# Patient Record
Sex: Male | Born: 1935 | Race: White | Hispanic: No | Marital: Married | Smoking: Never smoker
Health system: Southern US, Community
[De-identification: ages and names within clinical notes are randomized; demographics above are authoritative.]

## PROBLEM LIST (undated history)

## (undated) DIAGNOSIS — I2510 Atherosclerotic heart disease of native coronary artery without angina pectoris: Secondary | ICD-10-CM

## (undated) HISTORY — PX: CAROTID STENT: SHX1301

## (undated) HISTORY — PX: BACK SURGERY: SHX140

---

## 2000-02-03 ENCOUNTER — Encounter: Payer: Self-pay | Admitting: Emergency Medicine

## 2000-02-03 ENCOUNTER — Inpatient Hospital Stay (HOSPITAL_COMMUNITY): Admission: EM | Admit: 2000-02-03 | Discharge: 2000-02-06 | Payer: Self-pay | Admitting: Emergency Medicine

## 2000-02-04 HISTORY — PX: CARDIAC CATHETERIZATION: SHX172

## 2004-01-31 ENCOUNTER — Ambulatory Visit (HOSPITAL_COMMUNITY): Admission: RE | Admit: 2004-01-31 | Discharge: 2004-01-31 | Payer: Self-pay | Admitting: *Deleted

## 2004-03-24 HISTORY — PX: DOPPLER ECHOCARDIOGRAPHY: SHX263

## 2008-09-12 ENCOUNTER — Encounter: Admission: RE | Admit: 2008-09-12 | Discharge: 2008-09-12 | Payer: Self-pay | Admitting: Endocrinology

## 2009-06-17 ENCOUNTER — Emergency Department (HOSPITAL_COMMUNITY): Admission: EM | Admit: 2009-06-17 | Discharge: 2009-06-17 | Payer: Self-pay | Admitting: Emergency Medicine

## 2010-08-09 NOTE — Op Note (Signed)
NAME:  Austin Mays, Austin Mays           ACCOUNT NO.:  [id number]   MEDICAL RECORD NO.:  [id number]          PATIENT TYPE:  AMB   LOCATION:  ENDO                         FACILITY:  [facility]   PHYSICIAN:  Georgiana Spinner, M.D.    DATE OF BIRTH:  1923-11-20   DATE OF PROCEDURE:  01/31/2004  DATE OF DISCHARGE:                                 OPERATIVE REPORT   PROCEDURE:  Upper endoscopy.   INDICATIONS:  GERD and question of Hemoccult positivity.   ANESTHESIA:  Demerol 40, Versed 4 mg.   PROCEDURE:  With the patient mildly sedated in the left lateral decubitus  position, the Olympus videoscopic endoscope was inserted in the mouth and  passed under direct vision through the esophagus which appeared normal. We  entered into the stomach ____________ hiatal hernia. Fundus, body, antrum,  duodenal bulb, and second portion of the duodenum all appeared normal. From  this point, the endoscope was slowly withdrawn, taking circumferential views  of the duodenal mucosa until the endoscope had been pulled back into the  stomach, placed in retroflexion and viewed the stomach from below. The  endoscope was straightened and withdrawn, taking circumferential views of  the remaining gastric and esophageal mucosa. The patient's vital signs and  pulse oximetry remained stable. The patient tolerated the procedure well  without apparent complications.   FINDINGS:  Hiatal hernia, otherwise unremarkable examination.   PLAN:  Proceed to colonoscopy.      GMO/MEDQ  D:  01/31/2004  T:  01/31/2004  Job:  [id number]

## 2010-08-09 NOTE — Discharge Summary (Signed)
[place]  Patient:    Austin Mays, Austin Mays                  MRN: [id number] Adm. Date:  [id number] Disc. Date: [id number] Attending:  Clovis Cao CC:         Aram Candela. Aleen Campi, M.D.   Discharge Summary  CHIEF COMPLAINT:  Elbow and arm pain.  HISTORY OF PRESENT ILLNESS:  This 77 year old man, who has a history of hypothyroidism, but not known coronary artery disease, developed discomfort in the left elbow and arm, while splitting wood.  The pain persisted, and he eventually came to the emergency room.  PAST MEDICAL HISTORY/FAMILY HISTORY/SOCIAL HISTORY/REVIEW OF SYSTEMS/PHYSICAL EXAMINATION:  Adequate additional details are to be found in the history and physical examination.  LABORATORY DATA:  Electrocardiogram showed sinus rhythm with a slightly increased R-wave in lead V1, but no ST segment abnormality.  White cells 6400, hemoglobin 12.9, hematocrit 36.0, platelets 247,000. Unremarkable differential.  Sodium 137, potassium 4.0, creatinine 1.2, BUN 18, blood sugar 90.  CPK with MB 415 total initially, 16.5 MB.  Peak total 492, peak MB 35.3.  Lipid profile and cholesterol 127, triglycerides 139, HDL 30, LDL 69.  TSH 4.664.  HOSPITAL COURSE:  He became free of chest pain after admission, but was noted to have abnormal enzymes.  He was taken to the cardiac catheterization laboratory by Dr. Aram Candela. Tysinger on February 04, 2000, and the cardiac catheterization carried out.  The findings: 1. Critical stenosis of the obtuse marginal. 2. Severe stenosis of the right posterior descending. 3. Moderate stenosis of the mid-LAD. 4. Normal left ventricular function. 5. Normal abdominal aorta and renal arteries.  A successful percutaneous transluminal coronary angioplasty and stenting of the obtuse marginal-I lesion was carried out, and a successful Perclose of the right femoral artery.  The patient tolerated this procedure well, and was  returned to his room.  The next day he ambulated, and he did, however, have quite a bit of groin bleeding initially.  This was eventually stopped when he remained stable for 24 hours. He was ambulatory and considered stable and ready for discharge. There was still considerable bruising of the right groin, but there was no hematoma.  Discussions were held with the patient regarding abstinence from smoking, which he did not do beforehand, and the appropriate use of medications.  He was also encouraged to walk one-half an hour or more daily.  New prescriptions were written, and medications were discussed in complete detail.  DISCHARGE MEDICATIONS: 1. Altace 5 mg q.d. 2. Plavix 75 mg q.d. 3. Aspirin one q.d. 4. Metoprolol 50 mg 1/2 b.i.d. 5. Lopid 60 mg b.i.d. (because of the low HDL level).  He was encouraged to work to lose 20 pounds.  FOLLOWUP:  With Dr. Jaclyn Prime. [place] in two weeks.  DISCHARGE PROCESS:  Including a discussion of all of the above was extensive, requiring more than 40 minutes.  CONDITION ON DISCHARGE:  Stable and improved.  OPERATION:  Cardiac catheterization with a percutaneous transluminal coronary angioplasty and stent of the obtuse marginal-I coronary artery.  COMPLICATIONS:  Moderate groin hematoma.  RETURN TO WORK:  Inapplicable. DD:  03/11/00 TD:  03/11/00 Job: [id number]

## 2010-08-09 NOTE — Cardiovascular Report (Signed)
[facility]  Patient:    Austin Mays, Austin Mays                  MRN: [id number] Proc. Date: 02/04/00 Adm. Date:  [id number] Attending:  Clovis Cao CC:         Jaclyn Prime. Lucas Mallow, M.D.  [facility]   Cardiac Catheterization  REFERRING PHYSICIAN:  Dr. Jaclyn Prime. [place].  PROCEDURES 1. Left heart catheterization. 2. Coronary cineangiography. 3. Left ventricular cineangiography. 4. Angioplasty with percutaneous transluminal coronary angioplasty and stent    placement of the first obtuse marginal branch. 5. Perclose of the right femoral artery.  INDICATIONS FOR PROCEDURES:  This 77 year old male was admitted to the [facility] on February 03, 2000 with severe anterior chest pain.  This occurred after moderate exertion with splitting firewood and in the emergency room, his cardiac enzymes were positive for myocardial ischemia; he was then scheduled for cardiac catheterization and possible angioplasty.  DESCRIPTION OF PROCEDURE:  After signing an informed consent, the patient was premedicated with 50 mg of Benadryl intravenously and brought to the cardiac catheterization lab.  His right groin was prepped and draped in a sterile fashion and anesthetized locally with 1% lidocaine.  A 6-French introducer sheath was inserted percutaneously into the right femoral artery.  A 6-French #4 Judkins right coronary catheter was used to make injections in the right coronary artery.  There was a very elongated thoracic aorta which required a 6-French #5 Judkins coronary catheter to make injections into the left coronary artery.  A 6-French pigtail catheter was used to measure pressures in the left ventricle and aorta and to make midstream injections into the left ventricle and abdominal aorta.  After noting a critical 99-100% stenosis of the first obtuse marginal branch, along with several other stenotic lesions, we elected to proceed  with angioplasty of this critical stenosis with TIMI-1 flow.  The other stenotic lesions were within the right coronary artery, with a moderate-to-severe ostial lesion and a severe distal lesion in the mid-posterior descending.  The LAD had segmental narrowing of 50% in the distal segment.  The first diagonal branch had a 50% stenosis.  With only one critical stenosis with slow antegrade flow, we elected to proceed with the angioplasty of the first obtuse marginal branch.  A 6-French JL5 guide catheter was selected and after proper preparation, it was advanced to the root of the aorta.  We encountered much difficulty in obtaining access through the left coronary artery and several catheters were used which caused twisting of the catheter and we finally were able to cannulate the ostium of the left coronary artery with the 6-French JL5 guide catheter.  After obtaining access to the left coronary ______ , we then selected a Hi-Torque Floppy guidewire, which was advanced into the left coronary artery and into the mid-circumflex. After multiple attempts, we were unable to pass this guidewire out the first obtuse marginal branch because of marked tortuosity and we then selected a short luge guidewire, with which we were able to engage the proximal segment of the first obtuse marginal branch.  After multiple attempts, we were unable to advance this wire through the critically stenotic area.  We then elected to advance a traverse catheter over this wire and after multiple attempts, we were unable to advance this traverse catheter over the luge guidewire into the proximal segment of the first obtuse marginal.  With the traverse catheter inside the OM-1, we  were then able to advance the luge wire across the lesion. This was a short wire and therefore, we needed to exchange the short luge for a longer wire and we first attempted a long Cross-It guidewire and with this guidewire, we were unable to  advance it through the lesion.  We then selected a long luge guidewire and with the luge guidewire, we were able to again cross the lesion in the OM-1 and advance it to the distal segment.  We then selected a 2.5 x 15.0-mm CrossSail balloon catheter and with moderate-to-severe difficulty, we were able to advance this catheter over the guidewire, after removing the traverse catheter, and we were able to advance it into the OM-1 and across the lesion.  We then made two inflations with the CrossSail balloon, the first at 12 atmospheres for 60 seconds and the second at 14 atmospheres for 3 minutes.  After these inflations, the CrossSail balloon catheter was removed and further injections showed rebound stenosis which was progressive and rebounded to essentially total occlusion again.  We then selected a 3.0 x 8.0-mm Penta stent deployment system and after proper preparation, this Penta stent was advanced over the guidewire and again, with very much difficulty, it was advanced into the OM-1 and placed within the lesion.  The stent was then deployed with two inflations, the first at 8 atmospheres for 24 seconds and the second at 12 atmospheres for 30 seconds. After full deployment of the stent, the deployment balloon was removed and injections again into the left coronary artery showed an excellent angiographic result with 0% residual lesion and no evidence for dissection or clot.  There was reestablishment of normal TIMI-3 antegrade flow.  The patient tolerated the procedure well and no complications were noted.  At the end of the procedure, the catheter and sheath were removed from the right femoral artery and hemostasis was easily obtained with the Perclose closure system. Also note that for venous access, we inserted a 5-French sheath in the right femoral vein and used this during the procedure for an added IV line.  At the end of the procedure, this sheath was removed and venous stasis was  obtained with a Perclose closure system, because the ACT was still markedly elevated.   MEDICATIONS GIVEN DURING THE PROCEDURE:  Heparin 6000 units IV, Integrilin drip per pharmacy protocol.  HEMODYNAMIC DATA:  Left ventricular pressure 153/0-13.  Aortic pressure 153/78 with a mean of 107.  Left ventricular ejection fraction was estimated at 60%.  CINE FINDINGS Coronary cineangiography: 1. Left coronary artery:  The ostium and left main appear normal. 2. Left anterior descending:  The LAD has irregularities throughout its    proximal, mid and distal segment without significant stenosis until the    distal segment, where there is a segmental 50% stenosis.  There is good    antegrade flow.  The first diagonal branch has a 50% ostial lesion. 3. Circumflex coronary artery:  The proximal segment appears normal.  There is    then a trifurcation with OM-1 and OM-2 arising at this trifurcation, along    with a large continuation of the circumflex in the A-V groove, forming a    dominant circumflex supplying the posterolateral circulation to the left    ventricle.  The first obtuse marginal branch has a 30-40% ostial lesion and    the proximal segment had a 99% stenosis with very poor antegrade flow,    TIMI-1; this was a moderate-sized vessel.  The OM-2  had a 50% ostial    lesion, followed by a 30% lesion in the proximal segment.  There was normal    antegrade flow in the OM-2.  The continuation of the circumflex coronary    artery in the A-V groove appears normal, with good flow into the    posterolateral branches. 4. Right coronary artery:  The right coronary artery has an ostial lesion of    60-70%.  There is post-stenotic dilatation, making accurate estimation of    the ostial lesion difficult.  The right coronary artery from the conus to    the acute angle has diffuse atherosclerotic plaque with 30-40% segmental    narrowing throughout, extending through the acute angle.  The right     coronary artery terminates with a posterior descending branch and the    mid-posterior descending has a severe focal 90% stenosis.  There is good    antegrade flow through the stenotic area.  Left ventricular cineangiography:  The left ventricular chamber size and contractility appear normal.  The ejection fraction was estimated at approximately 60%.  The mitral and aortic valves appear normal.  Abdominal aortogram:  The abdominal aorta and renal arteries appear normal.  Angioplasty cines:  Cines taken during the angioplasty procedure show proper positioning of the guidewire and balloon catheter and finally, good positioning of the stent and stent deployment system.  Final injections into the left coronary artery showed an excellent angiographic result with 0% residual lesion, reestablishment of normal TIMI-3 antegrade flow and no evidence of dissection or clot.  FINAL DIAGNOSES 1. Critical stenosis, 99%, of the first obtuse marginal branch with TIMI-1    antegrade flow. 2. Severe stenosis, mid right posterior descending and ostial right coronary    artery, small vessel. 3. Moderate stenosis, distal left anterior descending. 4. Normal left ventricular function. 5. Normal abdominal aortogram and renal arteries. 6. Successful angioplasty with percutaneous transluminal coronary angioplasty    and stent placement in the first obtuse marginal branch. 7. Successful Perclose of the right femoral artery and right femoral vein. DD:  02/04/00 TD:  02/04/00 Job: [id number]

## 2010-08-09 NOTE — Op Note (Signed)
NAME:  Austin Mays, Applegate Rivan           ACCOUNT NO.:  [id number]   MEDICAL RECORD NO.:  [id number]          PATIENT TYPE:  AMB   LOCATION:  ENDO                         FACILITY:  [facility]   PHYSICIAN:  Georgiana Spinner, M.D.    DATE OF BIRTH:  24-Oct-1923   DATE OF PROCEDURE:  01/31/2004  DATE OF DISCHARGE:                                 OPERATIVE REPORT   PROCEDURE:  Colonoscopy.   INDICATIONS:  Questionable Hemoccult positivity.   ANESTHESIA:  Demerol 40 mg, Versed 5 mg additionally.   PROCEDURE:  With the patient mildly sedated in the left lateral decubitus  position, the Olympus videoscopic pediatric colonoscope was inserted in the  rectum and passed under direct vision to the right colon and despite turning  in multiple positions and pressure applied to the abdomen we could not  advance past this point, so therefore it was withdrawn.  Subsequently the  Olympus videoscopic variable-stiffness adult colonoscope was inserted in the  rectum, and again with position changes and pressure applied to the abdomen,  we subsequently were able to reach the cecum, identified by the ileocecal  valve and appendiceal orifice, both of which were photographed.  From this  point the colonoscope was slowly withdrawn taking circumferential views of  the colonic mucosa, stopping to photograph a diverticulum seen along the way  that was scattered actually throughout the colon but more so in the sigmoid  colon until we reached the rectum, which appeared normal on direct and  retroflexed view.  The endoscope was straightened and withdrawn.  The  patient's vital signs and pulse oximetry remained stable.  The patient  tolerated the procedure well without apparent complications.   FINDINGS:  Very difficult, tortuous colonic examination but no gross lesions  seen other than diverticula.   PLAN:  Have the patient follow up with me as needed.      GMO/MEDQ  D:  01/31/2004  T:  01/31/2004  Job:  [id number]

## 2012-04-14 ENCOUNTER — Emergency Department (HOSPITAL_COMMUNITY): Payer: Medicare Other

## 2012-04-14 ENCOUNTER — Emergency Department (HOSPITAL_COMMUNITY)
Admission: EM | Admit: 2012-04-14 | Discharge: 2012-04-14 | Disposition: A | Discharge status: Home / Self Care | Payer: Medicare Other | Attending: Emergency Medicine | Admitting: Emergency Medicine

## 2012-04-14 ENCOUNTER — Encounter (HOSPITAL_COMMUNITY): Payer: Self-pay | Admitting: *Deleted

## 2012-04-14 DIAGNOSIS — Z9861 Coronary angioplasty status: Secondary | ICD-10-CM | POA: Insufficient documentation

## 2012-04-14 DIAGNOSIS — S20229A Contusion of unspecified back wall of thorax, initial encounter: Secondary | ICD-10-CM | POA: Insufficient documentation

## 2012-04-14 DIAGNOSIS — Z8739 Personal history of other diseases of the musculoskeletal system and connective tissue: Secondary | ICD-10-CM | POA: Insufficient documentation

## 2012-04-14 DIAGNOSIS — I2510 Atherosclerotic heart disease of native coronary artery without angina pectoris: Secondary | ICD-10-CM | POA: Insufficient documentation

## 2012-04-14 DIAGNOSIS — Z79899 Other long term (current) drug therapy: Secondary | ICD-10-CM | POA: Insufficient documentation

## 2012-04-14 DIAGNOSIS — Y92009 Unspecified place in unspecified non-institutional (private) residence as the place of occurrence of the external cause: Secondary | ICD-10-CM | POA: Insufficient documentation

## 2012-04-14 DIAGNOSIS — Y9389 Activity, other specified: Secondary | ICD-10-CM | POA: Insufficient documentation

## 2012-04-14 DIAGNOSIS — W1809XA Striking against other object with subsequent fall, initial encounter: Secondary | ICD-10-CM | POA: Insufficient documentation

## 2012-04-14 HISTORY — DX: Atherosclerotic heart disease of native coronary artery without angina pectoris: I25.10

## 2012-04-14 NOTE — ED Notes (Signed)
Pt lost balance and fell backwards landing on butt; hitting back on corner of chimney; fell about 5pm; got up and went inside and called this evening due to continued c/o pain in back/buttocks and left scapular area; no shortness of breath; no head or neck injury

## 2012-04-14 NOTE — ED Provider Notes (Signed)
History     CSN: [id number]  Arrival date & time 04/14/12  2117   First MD Initiated Contact with Patient 04/14/12 2213      Chief Complaint  Patient presents with  . Fall   HPI  History provided by the patient. Patient is a 79 year old male with history of CAD and prior back surgery who presents with complaints of back pain after fall. Patient states that he was returning home and reaching for his keys to open his door while holding a sac of [employer] and his walking cane in his other hand. He states he lost his balance and reached for the handrail on his porch but this caused him to swing around and hit his left back and again is a core of the break home. Patient also fell onto his buttocks. Patient denied having any head injury or LOC. Initially he denied having significant pain or soreness the later had more pain to his mid back near his scapula. Patient did take one of his prescribed Vicodin for pain is relief. He presents concerned of possible broken rib. Denies any shortness of breath or lightheadedness. Denies any low back or pain to the sacrum. Denies any weakness or numbness in extremities.    Past Medical History  Diagnosis Date  . Coronary artery disease     Past Surgical History  Procedure Date  . Back surgery   . Carotid stent     No family history on file.  History  Substance Use Topics  . Smoking status: Never Smoker   . Smokeless tobacco: Not on file  . Alcohol Use: No      Review of Systems  Respiratory: Negative for shortness of breath.   Cardiovascular: Negative for chest pain.  Musculoskeletal: Positive for back pain.  Neurological: Negative for dizziness, light-headedness and headaches.  All other systems reviewed and are negative.    Allergies  Review of patient's allergies indicates no known allergies.  Home Medications   Current Outpatient Rx  Name  Route  Sig  Dispense  Refill  . ATORVASTATIN CALCIUM 20 MG PO TABS   Oral   Take 10  mg by mouth daily.         Marland Kitchen CLOPIDOGREL BISULFATE 75 MG PO TABS   Oral   Take 75 mg by mouth daily.         Marland Kitchen HYDROCODONE-ACETAMINOPHEN 5-500 MG PO TABS   Oral   Take 1 tablet by mouth every 6 (six) hours as needed. Pain         . LEVOTHYROXINE SODIUM 88 MCG PO TABS   Oral   Take 88 mcg by mouth daily.         Marland Kitchen METOPROLOL TARTRATE 25 MG PO TABS   Oral   Take 12.5 mg by mouth 2 (two) times daily.           BP 114/62  Pulse 67  Temp 97.7 F (36.5 C) (Oral)  Resp 18  SpO2 99%  Physical Exam  Nursing note and vitals reviewed. Constitutional: He appears well-developed and well-nourished. No distress.  HENT:  Head: Normocephalic and atraumatic.  Neck: Normal range of motion. Neck supple.       No cervical midline tenderness  Cardiovascular: Normal rate and regular rhythm.   Pulmonary/Chest: Effort normal and breath sounds normal. No respiratory distress. He has no wheezes. He has no rales.  Abdominal: Soft. There is no tenderness. There is no rebound and no guarding.  Musculoskeletal:  Thoracic back: He exhibits tenderness and swelling. He exhibits no deformity.       Back:       No spinous tenderness. Normal extremities at baseline.  Neurological: He is alert.  Skin: Skin is warm.  Psychiatric: He has a normal mood and affect. His behavior is normal.    ED Course  Procedures    Dg Ribs Unilateral W/chest Left  04/14/2012  *RADIOLOGY REPORT*  Clinical Data: Status post fall; left upper back and rib pain.  LEFT RIBS AND CHEST - 3+ VIEW  Comparison: None.  Findings: No displaced rib fractures are seen.  The lungs are well-aerated.  Minimal left basilar atelectasis is noted.  There is no evidence of focal opacification, pleural effusion or pneumothorax.  The cardiomediastinal silhouette is borderline normal in size.  No acute osseous abnormalities are seen.  IMPRESSION:  1.  No displaced rib fractures seen. 2.  Minimal left basilar atelectasis noted; lungs  otherwise clear.   Original Report Authenticated By: Tonia Ghent, M.D.      1. Contusion of back       MDM  10:35 PM patient seen and evaluated. Patient resting on his right side does not appear in any acute distress.        Angus Seller, [place] 04/14/12 2333

## 2012-04-15 NOTE — ED Provider Notes (Signed)
Medical screening examination/treatment/procedure(s) were conducted as a shared visit with non-physician practitioner(s) and myself.  I personally evaluated the patient during the encounter  Pt had a mechanical fall.  No signs of serious injury on exam..  Dc home with supportive care.  Celene Kras, MD 04/15/12 [phone]

## 2013-12-19 ENCOUNTER — Ambulatory Visit (INDEPENDENT_AMBULATORY_CARE_PROVIDER_SITE_OTHER): Payer: 59 | Admitting: Internal Medicine

## 2013-12-19 ENCOUNTER — Encounter: Payer: Self-pay | Admitting: Internal Medicine

## 2013-12-19 VITALS — BP 126/76 | HR 50 | Ht 76.0 in | Wt 216.1 lb

## 2013-12-19 DIAGNOSIS — E038 Other specified hypothyroidism: Secondary | ICD-10-CM

## 2013-12-19 DIAGNOSIS — N400 Enlarged prostate without lower urinary tract symptoms: Secondary | ICD-10-CM

## 2013-12-19 DIAGNOSIS — I447 Left bundle-branch block, unspecified: Secondary | ICD-10-CM | POA: Insufficient documentation

## 2013-12-19 DIAGNOSIS — E039 Hypothyroidism, unspecified: Secondary | ICD-10-CM | POA: Insufficient documentation

## 2013-12-19 DIAGNOSIS — I10 Essential (primary) hypertension: Secondary | ICD-10-CM

## 2013-12-19 DIAGNOSIS — I2510 Atherosclerotic heart disease of native coronary artery without angina pectoris: Secondary | ICD-10-CM

## 2013-12-19 NOTE — Progress Notes (Signed)
OFFICE NOTE  Chief Complaint:  No complaints  Primary Care Physician: Michiel Sites, MD  HPI:  Austin Mays is an 80 year old male who I previously saw 2011, more than 3 years ago. He has been treated by Dr. Aleen Campi in the past. In 2001 he had a non-STEMI and will received a bare-metal stent to the obtuse marginal branch. He also has hypertension and dyslipidemia. He did get some care through the [place] in [place]. He's been on Plavix monotherapy for PAD and coronary disease. He also has hypothyroidism and BPH. He denies any chest pain or worsening shortness of breath with exertion however is not very active, but does still manage to drive.  PMHx:  Past Medical History  Diagnosis Date  . Coronary artery disease     Past Surgical History  Procedure Laterality Date  . Back surgery    . Carotid stent    . Cardiac catheterization  02/04/2000    mild MI - stent by Dr. Aleen Campi    FAMHx:  Family History  Problem Relation Age of Onset  . Cancer Son 22  . Leukemia Daughter 60    SOCHx:   reports that he has never smoked. He does not have any smokeless tobacco history on file. He reports that he does not drink alcohol. His drug history is not on file.  ALLERGIES:  No Known Allergies  ROS: A comprehensive review of systems was negative.  HOME MEDS: Current Outpatient Prescriptions  Medication Sig Dispense Refill  . atorvastatin (LIPITOR) 20 MG tablet Take 10 mg by mouth daily.      . clopidogrel (PLAVIX) 75 MG tablet Take 75 mg by mouth daily.      Marland Kitchen docusate sodium (COLACE) 100 MG capsule Take 100 mg by mouth 2 (two) times daily.      Marland Kitchen HYDROcodone-acetaminophen (NORCO/VICODIN) 5-325 MG per tablet Take 1 tablet by mouth every 6 (six) hours as needed for moderate pain.      Marland Kitchen latanoprost (XALATAN) 0.005 % ophthalmic solution Place 1 drop into both eyes at bedtime.      Marland Kitchen levothyroxine (SYNTHROID, LEVOTHROID) 88 MCG tablet Take 88 mcg by mouth daily.        . metoprolol tartrate (LOPRESSOR) 25 MG tablet Take 12.5 mg by mouth 2 (two) times daily.      . tamsulosin (FLOMAX) 0.4 MG CAPS capsule Take 0.4 mg by mouth.       No current facility-administered medications for this visit.    LABS/IMAGING: No results found for this or any previous visit (from the past 48 hour(s)). No results found.  VITALS: BP 126/76  Pulse 50  Ht  (1.93 m)  Wt 216 lb 1.6 oz (98.022 kg)  BMI 26.32 kg/m2  EXAM: General appearance: alert and no distress Neck: no carotid bruit and no JVD Lungs: clear to auscultation bilaterally Heart: regular rate and rhythm, S1, S2 normal, no murmur, click, rub or gallop Abdomen: soft, non-tender; bowel sounds normal; no masses,  no organomegaly Extremities: extremities normal, atraumatic, no cyanosis or edema Pulses: 2+ and symmetric Skin: Skin color, texture, turgor normal. No rashes or lesions Neurologic: Grossly normal Psych: Normal  EKG: Sinus bradycardia with first degree AV block, PVC, left bundle branch block at 50  ASSESSMENT: 1. Coronary artery disease status post PCI to the OM1 in 2001 2. Hypertension-controlled 3. Left bundle branch block 4. Dyslipidemia  PLAN: 1.   Austin Mays is doing fairly well given his age. He is not  describing any anginal symptoms. He is not exerting himself enough I believe to bring out the symptoms. His blood pressure is well controlled and has a stable left bundle with a low heart rate but he is not symptomatic with that. His cholesterol is followed by his primary care provider. Overall I think he is doing well for his age and would not change any medications.  Chrystie Nose, MD, [facility] Attending Cardiologist [facility]  HILTY,Kenneth C 12/19/2013, 4:40 PM

## 2013-12-19 NOTE — Patient Instructions (Signed)
Your physician wants you to follow-up in: 1 year with Dr. Hilty. You will receive a reminder letter in the mail two months in advance. If you don't receive a letter, please call our office to schedule the follow-up appointment.  

## 2014-07-24 ENCOUNTER — Encounter: Payer: Self-pay | Admitting: *Deleted

## 2014-10-13 ENCOUNTER — Other Ambulatory Visit: Payer: Self-pay | Admitting: Endocrinology

## 2014-10-13 DIAGNOSIS — R630 Anorexia: Secondary | ICD-10-CM

## 2014-10-13 DIAGNOSIS — R634 Abnormal weight loss: Secondary | ICD-10-CM

## 2014-10-18 ENCOUNTER — Inpatient Hospital Stay: Admission: RE | Admit: 2014-10-18 | Payer: Self-pay | Source: Ambulatory Visit

## 2014-11-08 ENCOUNTER — Ambulatory Visit
Admission: RE | Admit: 2014-11-08 | Discharge: 2014-11-08 | Disposition: A | Discharge status: Home / Self Care | Payer: Self-pay | Source: Ambulatory Visit | Attending: Endocrinology | Admitting: Endocrinology

## 2014-11-08 DIAGNOSIS — R630 Anorexia: Secondary | ICD-10-CM

## 2014-11-08 DIAGNOSIS — R634 Abnormal weight loss: Secondary | ICD-10-CM

## 2014-12-08 ENCOUNTER — Other Ambulatory Visit: Payer: Self-pay | Admitting: Physician Assistant

## 2014-12-08 DIAGNOSIS — M545 Low back pain: Secondary | ICD-10-CM

## 2014-12-12 ENCOUNTER — Inpatient Hospital Stay: Admission: RE | Admit: 2014-12-12 | Payer: Self-pay | Source: Ambulatory Visit

## 2014-12-27 ENCOUNTER — Other Ambulatory Visit: Payer: Self-pay

## 2016-02-07 ENCOUNTER — Ambulatory Visit (INDEPENDENT_AMBULATORY_CARE_PROVIDER_SITE_OTHER): Payer: 59 | Admitting: Orthopaedic Surgery

## 2016-02-07 ENCOUNTER — Ambulatory Visit (INDEPENDENT_AMBULATORY_CARE_PROVIDER_SITE_OTHER): Payer: 59

## 2016-02-07 DIAGNOSIS — M25552 Pain in left hip: Secondary | ICD-10-CM

## 2016-02-07 NOTE — Progress Notes (Signed)
Office Visit Note   Patient: Austin Mays           Date of Birth: January 01, 1924           MRN: [id number] Visit Date: 02/07/2016              Requested by: Darci NeedleWalter Kohut, MD [address] [place], [place] [id number] PCP: Michiel SitesKOHUT,WALTER DENNIS, MD   Assessment & Plan: Visit Diagnoses:  1. Pain in left hip     Plan: Right now he does not really hurt in his left hip at all and his exam is normal. I think this may be posture related and he may have pulled a muscle from where he slouches on the couch and then slumps over 2 head his dog. He does have Vicodin that he takes at home occasionally. He can also try anti-inflammatories and intermittent ice and heat. He'll follow up as needed.  Follow-Up Instructions: Return if symptoms worsen or fail to improve.   Orders:  Orders Placed This Encounter  Procedures  . XR HIP UNILAT W OR W/O PELVIS 1V LEFT   No orders of the defined types were placed in this encounter.     Procedures: No procedures performed   Clinical Data: No additional findings.   Subjective: Chief Complaint  Patient presents with  . Left Hip - Pain    Patient states he has on and off pain. On the "on days" it is a 10/10 pain. He wants this checked out     HPI His pain occurred acutely after leaning over from the couch to pad his dog. It was 10 out of 10 in terms of pain severity when it happened but now it's felt better as the week is progressed Review of Systems Negative for chest pain shortness breath fever or chills  Objective: Vital Signs: There were no vitals taken for this visit.  Physical Exam He is alert and oriented in no acute distress Ortho Exam Emanation of his left hip is basically normal today. He has fluid active and passive range of motion of the hip and is no pain to palpation around his proximal femur or pelvis Specialty Comments:  No specialty comments available.  Imaging: Xr Hip Unilat W Or W/o Pelvis 1v  Left  Result Date: 02/07/2016 An AP pelvis and a lateral his left hip show no acute bony injury. His hips are well located. There is no significant arthritic findings or cortical irregularities around either hip or pelvis. There is no evidence of any lytic lesion.    PMFS History: Patient Active Problem List   Diagnosis Date Noted  . CAD (coronary artery disease) 12/19/2013  . LBBB (left bundle branch block) 12/19/2013  . Hypothyroidism 12/19/2013  . BPH (benign prostatic hyperplasia) 12/19/2013  . HTN (hypertension) 12/19/2013   Past Medical History:  Diagnosis Date  . Coronary artery disease     Family History  Problem Relation Age of Onset  . Cancer Son [id number]  . Leukemia Daughter 10    Past Surgical History:  Procedure Laterality Date  . BACK SURGERY    . CARDIAC CATHETERIZATION  02/04/2000   mild MI - stent by Dr. Aleen Campiysinger  . CAROTID STENT    . DOPPLER ECHOCARDIOGRAPHY  2006   Social History   Occupational History  . Not on file.   Social History Main Topics  . Smoking status: Never Smoker  . Smokeless tobacco: Not on file  . Alcohol use No  . Drug  use: Unknown  . Sexual activity: Not on file

## 2016-07-03 ENCOUNTER — Emergency Department (HOSPITAL_COMMUNITY): Payer: Medicare Other

## 2016-07-03 ENCOUNTER — Emergency Department (HOSPITAL_COMMUNITY)
Admission: EM | Admit: 2016-07-03 | Discharge: 2016-07-03 | Disposition: A | Discharge status: Home / Self Care | Payer: Medicare Other | Attending: Emergency Medicine | Admitting: Emergency Medicine

## 2016-07-03 ENCOUNTER — Encounter (HOSPITAL_COMMUNITY): Payer: Self-pay | Admitting: Emergency Medicine

## 2016-07-03 DIAGNOSIS — I2510 Atherosclerotic heart disease of native coronary artery without angina pectoris: Secondary | ICD-10-CM | POA: Diagnosis not present

## 2016-07-03 DIAGNOSIS — I10 Essential (primary) hypertension: Secondary | ICD-10-CM | POA: Diagnosis not present

## 2016-07-03 DIAGNOSIS — R600 Localized edema: Secondary | ICD-10-CM | POA: Diagnosis not present

## 2016-07-03 DIAGNOSIS — E039 Hypothyroidism, unspecified: Secondary | ICD-10-CM | POA: Diagnosis not present

## 2016-07-03 DIAGNOSIS — M7989 Other specified soft tissue disorders: Secondary | ICD-10-CM | POA: Diagnosis present

## 2016-07-03 LAB — CBC WITH DIFFERENTIAL/PLATELET
BASOS PCT: 1 %
Basophils Absolute: 0 10*3/uL (ref 0.0–0.1)
Eosinophils Absolute: 0.1 10*3/uL (ref 0.0–0.7)
Eosinophils Relative: 3 %
HEMATOCRIT: 35 % — AB (ref 39.0–52.0)
HEMOGLOBIN: 11.8 g/dL — AB (ref 13.0–17.0)
LYMPHS ABS: 1.4 10*3/uL (ref 0.7–4.0)
Lymphocytes Relative: 36 %
MCH: 29 pg (ref 26.0–34.0)
MCHC: 33.7 g/dL (ref 30.0–36.0)
MCV: 86 fL (ref 78.0–100.0)
MONOS PCT: 12 %
Monocytes Absolute: 0.5 10*3/uL (ref 0.1–1.0)
NEUTROS ABS: 1.9 10*3/uL (ref 1.7–7.7)
NEUTROS PCT: 48 %
Platelets: 178 10*3/uL (ref 150–400)
RBC: 4.07 MIL/uL — ABNORMAL LOW (ref 4.22–5.81)
RDW: 14.5 % (ref 11.5–15.5)
WBC: 4 10*3/uL (ref 4.0–10.5)

## 2016-07-03 LAB — COMPREHENSIVE METABOLIC PANEL
ALT: 12 U/L — ABNORMAL LOW (ref 17–63)
ANION GAP: 7 (ref 5–15)
AST: 21 U/L (ref 15–41)
Albumin: 3.6 g/dL (ref 3.5–5.0)
Alkaline Phosphatase: 75 U/L (ref 38–126)
BUN: 26 mg/dL — ABNORMAL HIGH (ref 6–20)
CO2: 23 mmol/L (ref 22–32)
Calcium: 8.4 mg/dL — ABNORMAL LOW (ref 8.9–10.3)
Chloride: 109 mmol/L (ref 101–111)
Creatinine, Ser: 1.48 mg/dL — ABNORMAL HIGH (ref 0.61–1.24)
GFR calc non Af Amer: 40 mL/min — ABNORMAL LOW (ref >60)
GFR, EST AFRICAN AMERICAN: 46 mL/min — AB (ref >60)
Glucose, Bld: 99 mg/dL (ref 65–99)
Potassium: 3.9 mmol/L (ref 3.5–5.1)
SODIUM: 139 mmol/L (ref 135–145)
Total Bilirubin: 0.6 mg/dL (ref 0.3–1.2)
Total Protein: 6.6 g/dL (ref 6.5–8.1)

## 2016-07-03 LAB — I-STAT TROPONIN, ED: Troponin i, poc: 0 ng/mL (ref 0.00–0.08)

## 2016-07-03 LAB — BRAIN NATRIURETIC PEPTIDE: B NATRIURETIC PEPTIDE 5: 57.5 pg/mL (ref 0.0–100.0)

## 2016-07-03 NOTE — Discharge Instructions (Signed)
IMPORTANT PATIENT INSTRUCTIONS:  You have been scheduled for an Outpatient Vascular Study at [facility].   ° °If tomorrow is a Saturday or Sunday, please go to the [facility] Registration Desk at 8 am tomorrow morning and tell them you are there for a vascular study.  If tomorrow is a weekday (Monday-Friday), please go to [facility] at 8 am and tell them you are there for a vascular study. °

## 2016-07-03 NOTE — ED Provider Notes (Signed)
Emergency Department Provider Note   I have reviewed the triage vital signs and the nursing notes.   HISTORY  Chief Complaint Foot Swelling   HPI Austin Mays is a 83 y.o. male with PMH of CAD presents to the emergency department for evaluation of bilateral lower extremity edema over the past several days. Patient with no history of congestive heart failure or kidney disease. He injured the third toe on his right foot and both legs seem to be swelling since. No drainage. No fever, chills, or weakness. Patient is ambulatory with mild pain. No history or DVT/PE. No CP or SOB. No radion of symptoms.   Past Medical History:  Diagnosis Date  . Coronary artery disease     Patient Active Problem List   Diagnosis Date Noted  . CAD (coronary artery disease) 12/19/2013  . LBBB (left bundle branch block) 12/19/2013  . Hypothyroidism 12/19/2013  . BPH (benign prostatic hyperplasia) 12/19/2013  . HTN (hypertension) 12/19/2013    Past Surgical History:  Procedure Laterality Date  . BACK SURGERY    . CARDIAC CATHETERIZATION  02/04/2000   mild MI - stent by Dr. Aleen Campi  . CAROTID STENT    . DOPPLER ECHOCARDIOGRAPHY  2006    Current Outpatient Rx  . Order #: [id number] Class: Historical Med  . Order #: [id number] Class: Historical Med  . Order #: [id number] Class: Historical Med  . Order #: [id number] Class: Historical Med  . Order #: [id number] Class: Historical Med  . Order #: [id number] Class: Historical Med    Allergies Patient has no known allergies.  Family History  Problem Relation Age of Onset  . Cancer Son 45  . Leukemia Daughter 82    Social History Social History  Substance Use Topics  . Smoking status: Never Smoker  . Smokeless tobacco: Never Used  . Alcohol use No    Review of Systems  Constitutional: No fever/chills Eyes: No visual changes. ENT: No sore throat. Cardiovascular: Denies chest pain. Respiratory: Denies shortness of breath. Gastrointestinal: No  abdominal pain.  No nausea, no vomiting.  No diarrhea.  No constipation. Genitourinary: Negative for dysuria. Musculoskeletal: Negative for back pain. Positive B/L LE edema (R>L).  Skin: Negative for rash.  Neurological: Negative for headaches, focal weakness or numbness.  10-point ROS otherwise negative.  ____________________________________________   PHYSICAL EXAM:  VITAL SIGNS: ED Triage Vitals  Enc Vitals Group     BP 07/03/16 1924 (!) 149/78     Pulse Rate 07/03/16 1924 66     Resp 07/03/16 1924 (!) 64     Temp 07/03/16 1924 97.8 F (36.6 C)     Temp Source 07/03/16 1924 Oral     SpO2 07/03/16 1924 93 %     Weight 07/03/16 1935 220 lb (99.8 kg)     Height 07/03/16 1935  (1.93 m)   Constitutional: Alert and oriented. Well appearing and in no acute distress. Eyes: Conjunctivae are normal. Head: Atraumatic. Nose: No congestion/rhinnorhea. Mouth/Throat: Mucous membranes are moist.  Oropharynx non-erythematous. Neck: No stridor.  Cardiovascular: Normal rate, regular rhythm. Good peripheral circulation. Grossly normal heart sounds.   Respiratory: Normal respiratory effort.  No retractions. Lungs CTAB. Gastrointestinal: Soft and nontender. No distention.  Musculoskeletal: No lower extremity tenderness. Bilateral LE edema. No redness or warmth. No drainage. No gross deformities of extremities. Neurologic:  Normal speech and language. No gross focal neurologic deficits are appreciated.  Skin:  Skin is warm, dry and intact.  ____________________________________________   LABS (all labs ordered are listed,  but only abnormal results are displayed)  Labs Reviewed  COMPREHENSIVE METABOLIC PANEL - Abnormal; Notable for the following:       Result Value   BUN 26 (*)    Creatinine, Ser 1.48 (*)    Calcium 8.4 (*)    ALT 12 (*)    GFR calc non Af Amer 40 (*)    GFR calc Af Amer 46 (*)    All other components within normal limits  CBC WITH DIFFERENTIAL/PLATELET -  Abnormal; Notable for the following:    RBC 4.07 (*)    Hemoglobin 11.8 (*)    HCT 35.0 (*)    All other components within normal limits  BRAIN NATRIURETIC PEPTIDE  I-STAT TROPOININ, ED   ____________________________________________  EKG   EKG Interpretation  Date/Time:  Thursday July 03 2016 19:55:13 EDT Ventricular Rate:  60 PR Interval:    QRS Duration: 169 QT Interval:  495 QTC Calculation: 495 R Axis:   -18 Text Interpretation:  Sinus rhythm Prolonged PR interval Left bundle branch block New LBBB but last tracing 2001. No STEMI.  Confirmed by Marylon Verno MD, Merion Grimaldo [id number]) on 07/03/2016 11:15:20 PM Also confirmed by Ruthann Angulo MD, Alok Minshall [phone]), editor [place] CT, Jola Babinski [id number])  on 07/04/2016 7:43:47 AM       ____________________________________________  RADIOLOGY  Dg Chest 2 View  Result Date: 07/03/2016 CLINICAL DATA:  Swelling of the lower extremities and weakness. EXAM: CHEST  2 VIEW COMPARISON:  04/14/2012 CXR FINDINGS: Air-fluid level within a moderate to large hiatal hernia is again visualized. The heart is top-normal in size. There is aortic atherosclerosis without aneurysmal dilatation. No acute pneumonic consolidation, effusion or pneumothorax. Degenerative changes are seen along the dorsal spine. IMPRESSION: No active cardiopulmonary disease.  Moderate to large hiatal hernia. Electronically Signed   By: Tollie Eth M.D.   On: 07/03/2016 20:45    ____________________________________________   PROCEDURES  Procedure(s) performed:   Procedures  None ____________________________________________   INITIAL IMPRESSION / ASSESSMENT AND PLAN / ED COURSE  Pertinent labs & imaging results that were available during my care of the patient were reviewed by me and considered in my medical decision making (see chart for details).  Patient resents to the emergency room in for evaluation of bilateral lower extremity edema in the setting of trauma several days ago. Child feel  that the foot traumas related to the patient's swelling. He seems to have mild swelling of his bilateral lower extremities. He insisted the right lower shin he may be larger than the left which may be true but only slightly. No overlying cellulitis. Normal pulses and sensation. No evidence of cellulitis, septic joint, or injury. Plan for lab work and chest x-ray with bilateral LE edema.   CXR and labs are largely unremarkable. No evidence of infection and relatively low suspicion for DVT. Plan for LE [place] in the AM when study is available. No anticoagulation overnight pending study. Provided information for obtaining vascular scan at discharge.   At this time, I do not feel there is any life-threatening condition present. I have reviewed and discussed all results (EKG, imaging, lab, urine as appropriate), exam findings with patient. I have reviewed nursing notes and appropriate previous records.  I feel the patient is safe to be discharged home without further emergent workup. Discussed usual and customary return precautions. Patient and family (if present) verbalize understanding and are comfortable with this plan.  Patient will follow-up with their primary care provider. If they do not have a primary  care provider, information for follow-up has been provided to them. All questions have been answered.  ____________________________________________  FINAL CLINICAL IMPRESSION(S) / ED DIAGNOSES  Final diagnoses:  Leg edema     MEDICATIONS GIVEN DURING THIS VISIT:  None  NEW OUTPATIENT MEDICATIONS STARTED DURING THIS VISIT:  None   Note:  This document was prepared using Dragon voice recognition software and may include unintentional dictation errors.  Alona Bene, MD Emergency Medicine   Maia Plan, MD 07/04/16 0930

## 2016-07-03 NOTE — ED Triage Notes (Signed)
Patient complaining of swollen feet. Patient stub his three toe on the right foot a week ago. Patient states they are swollen up worse since then. He states both are swollen but the right is more swollen than the left.

## 2016-07-04 ENCOUNTER — Ambulatory Visit (HOSPITAL_COMMUNITY)
Admission: RE | Admit: 2016-07-04 | Discharge: 2016-07-04 | Disposition: A | Discharge status: Home / Self Care | Payer: Medicare Other | Source: Ambulatory Visit | Attending: Emergency Medicine | Admitting: Emergency Medicine

## 2016-07-04 DIAGNOSIS — M7989 Other specified soft tissue disorders: Secondary | ICD-10-CM | POA: Diagnosis not present

## 2016-07-04 NOTE — Progress Notes (Signed)
*  Preliminary Results* Right lower extremity venous duplex completed. Right lower extremity is negative for deep vein thrombosis. There is no evidence of right Baker's cyst.  07/04/2016 9:10 AM  Gertie Fey, BS, RVT, RDCS, RDMS

## 2016-12-18 ENCOUNTER — Ambulatory Visit (INDEPENDENT_AMBULATORY_CARE_PROVIDER_SITE_OTHER): Payer: Medicare Other | Admitting: Orthopaedic Surgery

## 2016-12-18 DIAGNOSIS — M545 Low back pain, unspecified: Secondary | ICD-10-CM

## 2016-12-18 DIAGNOSIS — G8929 Other chronic pain: Secondary | ICD-10-CM | POA: Diagnosis not present

## 2016-12-18 MED ORDER — LIDOCAINE 5 % EX PTCH: 1.0000 | MEDICATED_PATCH | CUTANEOUS | 0 refills | Status: DC

## 2016-12-18 NOTE — Progress Notes (Signed)
The patient is a 83 year old that seen before. He comes in today with chronic left-sided low back pain to the left side of his back. He will to make sure there was no abscess. Apparently set for back surgeries in the past.  On exam inspection of the skin shows no swelling or redness. There is no evidence of shingles. His old incisions is well healed nicely. His pain seems to be mainly in the paraspinal muscles.  I gave him reassurance that there was no abscess and this is mainly arthritic changes from previous surgeries and a lot of this is age-related and muscle related. He would probably benefit from topical anti-inflammatories either over-the-counter or prescription dose treatment Lidoderm patch. We can send some of these things and for him. All questions and concerns were answered and addressed. A follow-up as needed.

## 2017-06-16 ENCOUNTER — Encounter (HOSPITAL_COMMUNITY): Payer: Self-pay | Admitting: Nurse Practitioner

## 2017-06-16 ENCOUNTER — Emergency Department (HOSPITAL_COMMUNITY)
Admission: EM | Admit: 2017-06-16 | Discharge: 2017-06-16 | Discharge status: Against Medical Advice | Payer: Medicare Other | Attending: Emergency Medicine | Admitting: Emergency Medicine

## 2017-06-16 ENCOUNTER — Other Ambulatory Visit: Payer: Self-pay

## 2017-06-16 DIAGNOSIS — Z5321 Procedure and treatment not carried out due to patient leaving prior to being seen by health care provider: Secondary | ICD-10-CM | POA: Insufficient documentation

## 2017-06-16 DIAGNOSIS — R42 Dizziness and giddiness: Secondary | ICD-10-CM | POA: Diagnosis present

## 2017-06-16 LAB — COMPREHENSIVE METABOLIC PANEL
ALT: 14 U/L — AB (ref 17–63)
AST: 28 U/L (ref 15–41)
Albumin: 3.9 g/dL (ref 3.5–5.0)
Alkaline Phosphatase: 91 U/L (ref 38–126)
Anion gap: 10 (ref 5–15)
BUN: 38 mg/dL — AB (ref 6–20)
CHLORIDE: 108 mmol/L (ref 101–111)
CO2: 25 mmol/L (ref 22–32)
CREATININE: 1.66 mg/dL — AB (ref 0.61–1.24)
Calcium: 9.2 mg/dL (ref 8.9–10.3)
GFR, EST AFRICAN AMERICAN: 40 mL/min — AB (ref >60)
GFR, EST NON AFRICAN AMERICAN: 34 mL/min — AB (ref >60)
Glucose, Bld: 128 mg/dL — ABNORMAL HIGH (ref 65–99)
Potassium: 4.1 mmol/L (ref 3.5–5.1)
SODIUM: 143 mmol/L (ref 135–145)
Total Bilirubin: 0.5 mg/dL (ref 0.3–1.2)
Total Protein: 7.2 g/dL (ref 6.5–8.1)

## 2017-06-16 LAB — CBC
HCT: 38.1 % — ABNORMAL LOW (ref 39.0–52.0)
Hemoglobin: 12.4 g/dL — ABNORMAL LOW (ref 13.0–17.0)
MCH: 29.5 pg (ref 26.0–34.0)
MCHC: 32.5 g/dL (ref 30.0–36.0)
MCV: 90.7 fL (ref 78.0–100.0)
PLATELETS: 193 10*3/uL (ref 150–400)
RBC: 4.2 MIL/uL — AB (ref 4.22–5.81)
RDW: 14.4 % (ref 11.5–15.5)
WBC: 4.9 10*3/uL (ref 4.0–10.5)

## 2017-06-16 LAB — LIPASE, BLOOD: LIPASE: 32 U/L (ref 11–51)

## 2017-06-16 MED ORDER — SODIUM CHLORIDE 0.9 % IV BOLUS: 500.0000 mL | Freq: Once | INTRAVENOUS | Status: DC

## 2017-06-16 NOTE — ED Triage Notes (Signed)
Patient is brought to the ER for dizziness and nausea and vomiting. Primary care office concerned he may be dehydrated.

## 2018-04-14 ENCOUNTER — Ambulatory Visit (INDEPENDENT_AMBULATORY_CARE_PROVIDER_SITE_OTHER): Payer: Medicare Other | Admitting: Orthopaedic Surgery

## 2018-04-14 ENCOUNTER — Ambulatory Visit (INDEPENDENT_AMBULATORY_CARE_PROVIDER_SITE_OTHER): Payer: Self-pay

## 2018-04-14 ENCOUNTER — Encounter (INDEPENDENT_AMBULATORY_CARE_PROVIDER_SITE_OTHER): Payer: Self-pay | Admitting: Orthopaedic Surgery

## 2018-04-14 DIAGNOSIS — M545 Low back pain, unspecified: Secondary | ICD-10-CM

## 2018-04-14 DIAGNOSIS — M25552 Pain in left hip: Secondary | ICD-10-CM

## 2018-04-14 NOTE — Progress Notes (Signed)
Office Visit Note   Patient: Austin Mays           Date of Birth: 12/14/1923           MRN: [id number] Visit Date: 04/14/2018              Requested by: Pearson Grippe, MD [address] [place], [place] [address] PCP: Pearson Grippe, MD   Assessment & Plan: Visit Diagnoses:  1. Pain in left hip   2. Acute bilateral low back pain, unspecified whether sciatica present     Plan: After reviewing the films with the patient and his daughter-in-law who is present today discussed possible treatments which would include physical therapy and we definitely should get an MRI to evaluate the likely stenosis of his lumbar spine but really do not feel that he is a candidate for any type of surgical intervention.  He does not want any type of therapy and he understands that his age and health status would not allow for any type of surgical intervention.  Therefore recommend that he use a walker which he is currently doing when ambulating mostly transfer from chair to wheelchair.  He can follow-up with [place] on as-needed basis.  Follow-Up Instructions: Return if symptoms worsen or fail to improve.   Orders:  Orders Placed This Encounter  Procedures  . XR HIP UNILAT W OR W/O PELVIS 1V LEFT  . XR Lumbar Spine 2-3 Views   No orders of the defined types were placed in this encounter.     Procedures: No procedures performed   Clinical Data: No additional findings.   Subjective: Chief Complaint  Patient presents with  . Left Hip - Pain, Weakness    HPI Austin Mays is a 85 year old male who was seen for left hip pain and weakness of his left leg.  States that weakness began about 2 weeks ago.  He has had no known injury.  He does have a history of back surgery is not sure who actually did the surgery or how long ago.  He does note that he had a left foot drop after that surgery.  Is having no radicular pain down either leg.  No change in bowel bladder function. Review of  Systems Please see HPI otherwise negative  Objective: Vital Signs: There were no vitals taken for this visit.  Physical Exam Constitutional:      General: He is awake.     Appearance: He is underweight.  Pulmonary:     Effort: Pulmonary effort is normal.  Neurological:     Mental Status: He is alert and oriented to person, place, and time.  Psychiatric:        Mood and Affect: Mood normal.        Behavior: Behavior normal.     Ortho Exam Negative straight leg raise on the right.  Left straight leg raise causes wince but he denies any pain or radicular symptoms.  Atrophy of the quad muscles bilaterally.  Dropfoot bilaterally.  Plantarflexion bilateral feet 5 out of 5 strengths.  Extension bilateral lower legs 5 out of 5 strengths flexion bilateral lower legs 5-5 strengths.  5-5 strength with hip flexion on the right .  Unable to flex his left hip.  5 5 strength with abduction and adduction of both hips against resistance.  Unable to palpate dorsal pedal pulses bilaterally feet or warm and dry.  Good range of motion bilateral hips without pain.  Nontender over the trochanteric region both  hips Specialty Comments:  No specialty comments available.  Imaging: Xr Hip Unilat W Or W/o Pelvis 1v Left  Result Date: 04/14/2018 2 views left hip: No acute fracture.  Hips well located.  Hip joints well maintained.  Xr Lumbar Spine 2-3 Views  Result Date: 04/14/2018 Lumbar spine AP lateral views: Severe degenerative changes with disc space narrowing lower lumbar spine.  Loss of lordotic curvature.  No acute fractures noted.  Significant aortic arthrosclerosis noted.    PMFS History: Patient Active Problem List   Diagnosis Date Noted  . Chronic left-sided low back pain without sciatica 12/18/2016  . CAD (coronary artery disease) 12/19/2013  . LBBB (left bundle branch block) 12/19/2013  . Hypothyroidism 12/19/2013  . BPH (benign prostatic hyperplasia) 12/19/2013  . HTN (hypertension)  12/19/2013   Past Medical History:  Diagnosis Date  . Coronary artery disease     Family History  Problem Relation Age of Onset  . Cancer Son 29  . Leukemia Daughter 10    Past Surgical History:  Procedure Laterality Date  . BACK SURGERY    . CARDIAC CATHETERIZATION  02/04/2000   mild MI - stent by Dr. Aleen Campi  . CAROTID STENT    . DOPPLER ECHOCARDIOGRAPHY  2006   Social History   Occupational History  . Not on file  Tobacco Use  . Smoking status: Never Smoker  . Smokeless tobacco: Never Used  Substance and Sexual Activity  . Alcohol use: No  . Drug use: No  . Sexual activity: Not on file

## 2019-02-15 ENCOUNTER — Ambulatory Visit: Payer: Medicare Other | Admitting: Podiatry

## 2019-02-24 ENCOUNTER — Ambulatory Visit: Payer: Medicare Other | Admitting: Podiatry

## 2019-09-11 ENCOUNTER — Ambulatory Visit (HOSPITAL_COMMUNITY)
Admission: EM | Admit: 2019-09-11 | Discharge: 2019-09-11 | Disposition: A | Discharge status: Home / Self Care | Payer: Medicare Other | Source: Home / Self Care

## 2019-09-11 ENCOUNTER — Emergency Department (HOSPITAL_COMMUNITY)
Admission: EM | Admit: 2019-09-11 | Discharge: 2019-09-11 | Disposition: A | Discharge status: Home / Self Care | Payer: Medicare Other | Attending: Emergency Medicine | Admitting: Emergency Medicine

## 2019-09-11 ENCOUNTER — Encounter (HOSPITAL_COMMUNITY): Payer: Self-pay | Admitting: Emergency Medicine

## 2019-09-11 ENCOUNTER — Emergency Department (HOSPITAL_COMMUNITY): Payer: Medicare Other

## 2019-09-11 ENCOUNTER — Other Ambulatory Visit: Payer: Self-pay

## 2019-09-11 ENCOUNTER — Encounter (HOSPITAL_COMMUNITY): Payer: Self-pay

## 2019-09-11 DIAGNOSIS — R05 Cough: Secondary | ICD-10-CM | POA: Insufficient documentation

## 2019-09-11 DIAGNOSIS — R0981 Nasal congestion: Secondary | ICD-10-CM | POA: Insufficient documentation

## 2019-09-11 DIAGNOSIS — R519 Headache, unspecified: Secondary | ICD-10-CM | POA: Insufficient documentation

## 2019-09-11 DIAGNOSIS — R059 Cough, unspecified: Secondary | ICD-10-CM

## 2019-09-11 DIAGNOSIS — R5383 Other fatigue: Secondary | ICD-10-CM | POA: Diagnosis not present

## 2019-09-11 DIAGNOSIS — E039 Hypothyroidism, unspecified: Secondary | ICD-10-CM | POA: Diagnosis not present

## 2019-09-11 DIAGNOSIS — R55 Syncope and collapse: Secondary | ICD-10-CM | POA: Insufficient documentation

## 2019-09-11 DIAGNOSIS — Z79899 Other long term (current) drug therapy: Secondary | ICD-10-CM | POA: Insufficient documentation

## 2019-09-11 DIAGNOSIS — I2510 Atherosclerotic heart disease of native coronary artery without angina pectoris: Secondary | ICD-10-CM | POA: Insufficient documentation

## 2019-09-11 DIAGNOSIS — Z7902 Long term (current) use of antithrombotics/antiplatelets: Secondary | ICD-10-CM | POA: Diagnosis not present

## 2019-09-11 DIAGNOSIS — I10 Essential (primary) hypertension: Secondary | ICD-10-CM | POA: Insufficient documentation

## 2019-09-11 DIAGNOSIS — R42 Dizziness and giddiness: Secondary | ICD-10-CM | POA: Diagnosis not present

## 2019-09-11 LAB — HEPATIC FUNCTION PANEL
ALT: 10 U/L (ref 0–44)
AST: 18 U/L (ref 15–41)
Albumin: 3 g/dL — ABNORMAL LOW (ref 3.5–5.0)
Alkaline Phosphatase: 58 U/L (ref 38–126)
Bilirubin, Direct: 0.5 mg/dL — ABNORMAL HIGH (ref 0.0–0.2)
Indirect Bilirubin: 1.6 mg/dL — ABNORMAL HIGH (ref 0.3–0.9)
Total Bilirubin: 2.1 mg/dL — ABNORMAL HIGH (ref 0.3–1.2)
Total Protein: 5.8 g/dL — ABNORMAL LOW (ref 6.5–8.1)

## 2019-09-11 LAB — TSH: TSH: 12.88 u[IU]/mL — ABNORMAL HIGH (ref 0.350–4.500)

## 2019-09-11 LAB — CBC
HCT: 36 % — ABNORMAL LOW (ref 39.0–52.0)
Hemoglobin: 11.8 g/dL — ABNORMAL LOW (ref 13.0–17.0)
MCH: 30.6 pg (ref 26.0–34.0)
MCHC: 32.8 g/dL (ref 30.0–36.0)
MCV: 93.5 fL (ref 80.0–100.0)
Platelets: 183 10*3/uL (ref 150–400)
RBC: 3.85 MIL/uL — ABNORMAL LOW (ref 4.22–5.81)
RDW: 15.5 % (ref 11.5–15.5)
WBC: 11.7 10*3/uL — ABNORMAL HIGH (ref 4.0–10.5)
nRBC: 0 % (ref 0.0–0.2)

## 2019-09-11 LAB — BASIC METABOLIC PANEL
Anion gap: 12 (ref 5–15)
BUN: 20 mg/dL (ref 8–23)
CO2: 25 mmol/L (ref 22–32)
Calcium: 8.6 mg/dL — ABNORMAL LOW (ref 8.9–10.3)
Chloride: 105 mmol/L (ref 98–111)
Creatinine, Ser: 1.83 mg/dL — ABNORMAL HIGH (ref 0.61–1.24)
GFR calc Af Amer: 36 mL/min — ABNORMAL LOW (ref >60)
GFR calc non Af Amer: 31 mL/min — ABNORMAL LOW (ref >60)
Glucose, Bld: 131 mg/dL — ABNORMAL HIGH (ref 70–99)
Potassium: 3.1 mmol/L — ABNORMAL LOW (ref 3.5–5.1)
Sodium: 142 mmol/L (ref 135–145)

## 2019-09-11 LAB — CBG MONITORING, ED: Glucose-Capillary: 119 mg/dL — ABNORMAL HIGH (ref 70–99)

## 2019-09-11 MED ORDER — POTASSIUM CHLORIDE CRYS ER 20 MEQ PO TBCR
40.0000 meq | EXTENDED_RELEASE_TABLET | Freq: Once | ORAL | Status: AC
  Administered 2019-09-11: 40 meq via ORAL
  Filled 2019-09-11: qty 2

## 2019-09-11 MED ORDER — SODIUM CHLORIDE 0.9 % IV BOLUS
1000.0000 mL | Freq: Once | INTRAVENOUS | Status: AC
  Administered 2019-09-11: 15:00:00 1000 mL via INTRAVENOUS

## 2019-09-11 MED ORDER — SODIUM CHLORIDE 0.9% FLUSH
3.0000 mL | Freq: Once | INTRAVENOUS | Status: AC
  Administered 2019-09-11: 13:00:00 3 mL via INTRAVENOUS

## 2019-09-11 NOTE — ED Triage Notes (Signed)
Pt from urgent care by ems. Pt's daughter brought pt to urgent care for congestion, while at [facility] pt had a near syncopal episode with soft BP. BP 110/60 with ems. Pt A&Ox4 upon ems arrival. Per daughter pt stays in bed most of day, may have gotten tired and possibly may have just fallen asleep d/t active day so far. Pt acting normal per daughter now, soft BP at baseline per family. CBG 119 at urgent care. Pt has received both Covid vaccinations. Ambulatory with walker at baseline.

## 2019-09-11 NOTE — ED Notes (Signed)
Pt is a two person assist to transfer to wheelchair and to get into car. Pt does live alone, daughter in law checks on him at least 2 x a day,

## 2019-09-11 NOTE — ED Triage Notes (Signed)
Pt present cough with congestion and dizziness. Symptoms for the cough and congestion last night. The symptoms for the dizziness started almost a month ago.

## 2019-09-11 NOTE — ED Notes (Signed)
Patient is being discharged from the Urgent Care and sent to the Emergency Department via EMS . Per [place], patient is in need of higher level of care due to needing higher level of care and became lethargic. Patient is aware and verbalizes understanding of plan of care.  Vitals:   09/11/19 1145  BP: (!) 100/53  Pulse: 77  Resp: 18  Temp: 98.9 F (37.2 C)  SpO2: 96%

## 2019-09-11 NOTE — ED Provider Notes (Signed)
[facility]GENC[facility] DEPARTMENT Provider Note   CSN: [id number] Arrival date & time: 09/11/19  1216     History Chief Complaint  Patient presents with   Near Syncope    Austin Mays is a 86 y.o. male.  86yo M w/ PMH including CAD, HTN, BPH, hypothyroidism who p/w cough and possible syncopal episode.  Daughter states that the patient lives alone but she checks on him multiple times a day.  2 weeks ago, he had a fall and required assistance getting up afterwards.  He did not hit his head or lose consciousness.  He often sits around a lot and does not ambulate much but daughter states it seems like he has been laying in bed more since then.  He denies any areas of pain since the fall.  Since yesterday, she has noticed a productive cough and she was afraid he was developing congestion in his chest.  She brought him to an urgent care and while he was there they were concerned that he had a near syncopal episode, slumping over in the chair.  Daughter states that he sleeps most of the day in bed and she thinks that he just fell asleep in the seat while waiting there.  Blood glucose and blood pressure normal by EMS.  His appetite is not always very good but no significant changes recently.  No vomiting or fevers.  Patient denies any complaints at all and states he does not have any shortness of breath.  The history is provided by a relative and the patient.  Near Syncope       Past Medical History:  Diagnosis Date   Coronary artery disease     Patient Active Problem List   Diagnosis Date Noted   Chronic left-sided low back pain without sciatica 12/18/2016   CAD (coronary artery disease) 12/19/2013   LBBB (left bundle branch block) 12/19/2013   Hypothyroidism 12/19/2013   BPH (benign prostatic hyperplasia) 12/19/2013   HTN (hypertension) 12/19/2013    Past Surgical History:  Procedure Laterality Date   BACK SURGERY     CARDIAC CATHETERIZATION   02/04/2000   mild MI - stent by Dr. Glade Lloyd   CAROTID STENT     DOPPLER ECHOCARDIOGRAPHY  2006       Family History  Problem Relation Age of Onset   Cancer Son 75   Leukemia Daughter 10    Social History   Tobacco Use   Smoking status: Never Smoker   Smokeless tobacco: Never Used  Substance Use Topics   Alcohol use: No   Drug use: No    Home Medications Prior to Admission medications   Medication Sig Start Date End Date Taking? Authorizing Provider  acetaminophen (TYLENOL) 500 MG tablet Take 500 mg by mouth every 6 (six) hours as needed for moderate pain or headache.    Yes [provider]  bisacodyl (DULCOLAX) 10 MG suppository Place 10 mg rectally daily as needed for moderate constipation.   Yes [provider]  clopidogrel (PLAVIX) 75 MG tablet Take 75 mg by mouth daily.   Yes [provider]  HYDROcodone-acetaminophen (NORCO/VICODIN) 5-325 MG per tablet Take 1 tablet by mouth every 6 (six) hours as needed for moderate pain.   Yes [provider]  levothyroxine (SYNTHROID, LEVOTHROID) 88 MCG tablet Take 88 mcg by mouth daily.   Yes [provider]  metoprolol tartrate (LOPRESSOR) 25 MG tablet Take 12.5 mg by mouth 2 (two) times daily.   Yes  [provider]    Allergies    Patient has no known allergies.  Review of Systems   Review of Systems  Cardiovascular: Positive for near-syncope.   All other systems reviewed and are negative except that which was mentioned in HPI  Physical Exam Updated Vital Signs BP (!) 142/87    Pulse 66    Temp (!) 97.2 F (36.2 C) (Temporal)    Resp 19    Ht 6\' 4"  (1.93 m)    Wt 104.3 kg    SpO2 99%    BMI 28.00 kg/m   Physical Exam Vitals and nursing note reviewed.  Constitutional:      General: He is not in acute distress.    Appearance: He is well-developed.     Comments: Frail, elderly man resting but easily arousable  HENT:     Head: Normocephalic and atraumatic.       Mouth/Throat:     Mouth: Mucous membranes are dry.     Pharynx: Oropharynx is clear.  Eyes:     Conjunctiva/sclera: Conjunctivae normal.     Pupils: Pupils are equal, round, and reactive to light.  Cardiovascular:     Rate and Rhythm: Normal rate and regular rhythm.     Heart sounds: Normal heart sounds. No murmur heard.   Pulmonary:     Effort: Pulmonary effort is normal.     Breath sounds: Normal breath sounds. No wheezing.  Abdominal:     General: Bowel sounds are normal. There is no distension.     Palpations: Abdomen is soft.     Tenderness: There is no abdominal tenderness.  Musculoskeletal:     Cervical back: Neck supple.     Right lower leg: No edema.     Left lower leg: No edema.  Skin:    General: Skin is warm and dry.  Neurological:     Mental Status: He is oriented to person, place, and time.     Comments: Following commands     ED Results / Procedures / Treatments   Labs (all labs ordered are listed, but only abnormal results are displayed) Labs Reviewed  BASIC METABOLIC PANEL - Abnormal; Notable for the following components:      Result Value   Potassium 3.1 (*)    Glucose, Bld 131 (*)    Creatinine, Ser 1.83 (*)    Calcium 8.6 (*)    GFR calc non Af Amer 31 (*)    GFR calc Af Amer 36 (*)    All other components within normal limits  CBC - Abnormal; Notable for the following components:   WBC 11.7 (*)    RBC 3.85 (*)    Hemoglobin 11.8 (*)    HCT 36.0 (*)    All other components within normal limits  HEPATIC FUNCTION PANEL - Abnormal; Notable for the following components:   Total Protein 5.8 (*)    Albumin 3.0 (*)    Total Bilirubin 2.1 (*)    Bilirubin, Direct 0.5 (*)    Indirect Bilirubin 1.6 (*)    All other components within normal limits  TSH - Abnormal; Notable for the following components:   TSH 12.880 (*)    All other components within normal limits  URINALYSIS, ROUTINE W REFLEX MICROSCOPIC  T4, FREE  CBG MONITORING, ED     EKG EKG Interpretation  Date/Time:  Sunday September 11 2019 12:35:05 EDT Ventricular Rate:  65 PR Interval:    QRS Duration: 184 QT Interval:  489  QTC Calculation: 509 R Axis:   2 Text Interpretation: Sinus tachycardia Multiform ventricular premature complexes Prolonged PR interval Left bundle branch block No significant change since last tracing Confirmed by Frederick Peers [phone]) on 09/11/2019 12:53:32 PM   Radiology DG Chest 2 View  Result Date: 09/11/2019 CLINICAL DATA:  Productive cough. EXAM: CHEST - 2 VIEW COMPARISON:  Chest radiograph dated 07/03/2016 FINDINGS: The heart size and mediastinal contours are within normal limits. Both lungs are clear. Degenerative changes are seen in the spine and right shoulder. IMPRESSION: No active cardiopulmonary disease. Electronically Signed   By: Romona Curls M.D.   On: 09/11/2019 13:45    Procedures Procedures (including critical care time)  Medications Ordered in ED Medications  sodium chloride flush (NS) 0.9 % injection 3 mL (3 mLs Intravenous Given 09/11/19 1248)  sodium chloride 0.9 % bolus 1,000 mL (1,000 mLs Intravenous New Bag/Given 09/11/19 1503)  potassium chloride SA (KLOR-CON) CR tablet 40 mEq (40 mEq Oral Given 09/11/19 1501)    ED Course  I have reviewed the triage vital signs and the nursing notes.  Pertinent labs & imaging results that were available during my care of the patient were reviewed by me and considered in my medical decision making (see chart for details).    MDM Rules/Calculators/A&P                          Pt sleepy but arousable on exam, at baseline per daughter. VS reassuring. EKG similar to previous. CXR clear.   Lab work overall reassuring with hemoglobin similar to previous, WBC 11.7, K 3.1, creatinine 1.83, 2019 was 1.66; slightly low albumin and protein which are likely due to his chronic poor p.o. intake.  His TSH is 12.8.  Daughter notes that he is noncompliant with his thyroid medication.  She  will try to ensure that he starts taking it regularly.  He has received potassium and fluid bolus, drinking Pepsi here.  We will obtain a UA to ensure no evidence of infection.  Otherwise his work-up has been reassuring here.  Daughter feels that he is at his baseline and feels fairly confident that he just fell asleep at the urgent care.  She will follow up with his PCP regarding his thyroid. Pt signed out pending UA and likely d/c.  Final Clinical Impression(s) / ED Diagnoses Final diagnoses:  Cough  Hypothyroidism, unspecified type    Rx / DC Orders ED Discharge Orders    None       Estevan Kersh, [place]rose [place], MD 09/11/19 1530

## 2019-09-11 NOTE — ED Notes (Signed)
While in the room with patient he was about to pass out, so Hallie (PA) told me to call 911 because patient was not coherent while asking him question. Pt is going to the ED for further evaluation.

## 2019-09-11 NOTE — ED Notes (Signed)
Report given to Jamie, RN charge nurse in ED. °

## 2019-09-11 NOTE — ED Notes (Signed)
ED Provider at bedside. 

## 2019-09-11 NOTE — ED Provider Notes (Signed)
[facility]    CSN: [id number] Arrival date & time: 09/11/19  1013      History   Chief Complaint Chief Complaint  Patient presents with  . Dizziness  . Cough    HPI Austin Mays is a 86 y.o. male history of CAD presenting today for evaluation of dizziness and cough/congestion.  Patient presents with his daughter who reports that of recently he has had increased dizziness especially with sitting upright.  Reports that she is also concerned as he has been coughing and has had more congestion than normal recently as well.  EMS was called and he was recommended to follow-up for evaluation of possible underlying pneumonia.  Episodes of associated intermittent headaches.  Oral intake has been at his baseline although he never has much of an appetite.  Denies abdominal pain.  Denies chest pain.  Patient lives by himself, but his daughter checks on him multiple times a day.  Reports most of his days are spent lying down/sleeping.  Concerned about possible dehydration.  HPI  Past Medical History:  Diagnosis Date  . Coronary artery disease     Patient Active Problem List   Diagnosis Date Noted  . Chronic left-sided low back pain without sciatica 12/18/2016  . CAD (coronary artery disease) 12/19/2013  . LBBB (left bundle branch block) 12/19/2013  . Hypothyroidism 12/19/2013  . BPH (benign prostatic hyperplasia) 12/19/2013  . HTN (hypertension) 12/19/2013    Past Surgical History:  Procedure Laterality Date  . BACK SURGERY    . CARDIAC CATHETERIZATION  02/04/2000   mild MI - stent by Dr. Glade Lloyd  . CAROTID STENT    . DOPPLER ECHOCARDIOGRAPHY  2006       Home Medications    Prior to Admission medications   Medication Sig Start Date End Date Taking? Authorizing Provider  acetaminophen (TYLENOL) 500 MG tablet Take 500 mg by mouth every 6 (six) hours as needed for moderate pain or headache.     [provider]  bisacodyl (DULCOLAX) 10 MG suppository  Place 10 mg rectally daily as needed for moderate constipation.    [provider]  clopidogrel (PLAVIX) 75 MG tablet Take 75 mg by mouth daily.    [provider]  HYDROcodone-acetaminophen (NORCO/VICODIN) 5-325 MG per tablet Take 1 tablet by mouth every 6 (six) hours as needed for moderate pain.    [provider]  levothyroxine (SYNTHROID, LEVOTHROID) 88 MCG tablet Take 88 mcg by mouth daily.    [provider]  metoprolol tartrate (LOPRESSOR) 25 MG tablet Take 12.5 mg by mouth 2 (two) times daily.    [provider]    Family History Family History  Problem Relation Age of Onset  . Cancer Son 73  . Leukemia Daughter 48    Social History Social History   Tobacco Use  . Smoking status: Never Smoker  . Smokeless tobacco: Never Used  Substance Use Topics  . Alcohol use: No  . Drug use: No     Allergies   Patient has no known allergies.   Review of Systems Review of Systems  Constitutional: Negative for activity change, appetite change, chills, fatigue and fever.  HENT: Positive for congestion. Negative for ear pain, rhinorrhea, sinus pressure, sore throat and trouble swallowing.   Eyes: Negative for discharge and redness.  Respiratory: Positive for cough. Negative for chest tightness and shortness of breath.   Cardiovascular: Negative for chest pain.  Gastrointestinal: Negative for abdominal pain, diarrhea, nausea and vomiting.  Musculoskeletal: Negative for myalgias.  Skin: Negative for rash.  Neurological: Positive for dizziness. Negative for light-headedness and headaches.     Physical Exam Triage Vital Signs ED Triage Vitals  Enc Vitals Group     BP 09/11/19 1145 (!) 100/53     Pulse Rate 09/11/19 1145 77     Resp 09/11/19 1145 18     Temp 09/11/19 1145 98.9 F (37.2 C)     Temp Source 09/11/19 1145 Oral     SpO2 09/11/19 1145 96 %     Weight --      Height --      Head Circumference --      Peak Flow --       Pain Score 09/11/19 1146 0     Pain Loc --      Pain Edu? --      Excl. in GC? --    No data found.  Updated Vital Signs BP (!) 100/53 (BP Location: Left Arm)   Pulse 77   Temp 98.9 F (37.2 C) (Oral)   Resp 18   SpO2 96%   Visual Acuity Right Eye Distance:   Left Eye Distance:   Bilateral Distance:    Right Eye Near:   Left Eye Near:    Bilateral Near:     Physical Exam Vitals and nursing note reviewed.  Constitutional:      Appearance: He is well-developed.     Comments: Appears tired  HENT:     Head: Normocephalic and atraumatic.     Nose: Nose normal.  Eyes:     Conjunctiva/sclera: Conjunctivae normal.  Cardiovascular:     Rate and Rhythm: Normal rate.  Pulmonary:     Effort: Pulmonary effort is normal. No respiratory distress.     Comments: Breathing comfortably at rest, CTABL, no wheezing, rales or other adventitious sounds auscultated  Abdominal:     General: There is no distension.  Musculoskeletal:        General: Normal range of motion.     Cervical back: Neck supple.  Skin:    General: Skin is warm and dry.  Neurological:     Mental Status: He is alert and oriented to person, place, and time.      UC Treatments / Results  Labs (all labs ordered are listed, but only abnormal results are displayed) Labs Reviewed  CBG MONITORING, ED - Abnormal; Notable for the following components:      Result Value   Glucose-Capillary 119 (*)    All other components within normal limits    EKG   Radiology DG Chest 2 View  Result Date: 09/11/2019 CLINICAL DATA:  Productive cough. EXAM: CHEST - 2 VIEW COMPARISON:  Chest radiograph dated 07/03/2016 FINDINGS: The heart size and mediastinal contours are within normal limits. Both lungs are clear. Degenerative changes are seen in the spine and right shoulder. IMPRESSION: No active cardiopulmonary disease. Electronically Signed   By: Romona Curls M.D.   On: 09/11/2019 13:45    Procedures Procedures  (including critical care time)  Medications Ordered in UC Medications - No data to display  Initial Impression / Assessment and Plan / UC Course  I have reviewed the triage vital signs and the nursing notes.  Pertinent labs & imaging results that were available during my care of the patient were reviewed by me and considered in my medical decision making (see chart for details).     Initially called to attend patient due to O2 of  89% obtained by nursing during triage.  Rechecked vitals and was able to obtain stable O2 with good waveform at 96% and heart rate of 77.  Patient breathing comfortably at rest, in no signs of respiratory distress or airway compromise.  Lungs clear to auscultation, slightly coarse cough noted in room.  While obtaining history from daughter patient became dazed, slightly leaning towards right side and was not responding to verbal or physical stimuli, patient came around after a couple of minutes and was reported to be back to his baseline by daughter.  She believes he is very tired due to wait, is not used to sitting upright for extended periods of time.  Blood sugar was checked and was 119.  EMS was called for transport to ED for further evaluation due to period of decreased responsiveness.  Unable to obtain EKG prior to transport with EMS.  Given patient's age, complaint of an episode of decreased responsiveness opted for further evaluation in emergency room in order to obtain blood sides as warranted as well as further observation.   Final Clinical Impressions(s) / UC Diagnoses   Final diagnoses:  None   Discharge Instructions   None    ED Prescriptions    None     PDMP not reviewed this encounter.   Lew Dawes, [place] 09/11/19 2203

## 2019-09-11 NOTE — ED Provider Notes (Signed)
Signout from Dr. Clarene Duke.  86 year old male here after possible syncopal event earlier today at urgent care.  Work-up is been fairly unremarkable.  Plan is to follow-up on urine if he can produce a sample but otherwise discharged to his daughter's care. Physical Exam  BP (!) 142/87   Pulse 66   Temp (!) 97.2 F (36.2 C) (Temporal)   Resp 19   Ht 6\' 4"  (1.93 m)   Wt 104.3 kg   SpO2 99%   BMI 28.00 kg/m   Physical Exam  ED Course/Procedures     Procedures  MDM  Patient unable to urinate here and does not want a catheterization.  Daughter is comfortable taking him home, says he is exhausted and would just like to get him back home now.  Return instructions discussed.       , MD 09/12/19 1101

## 2019-12-05 ENCOUNTER — Inpatient Hospital Stay (HOSPITAL_COMMUNITY)
Admission: EM | Admit: 2019-12-05 | Discharge: 2019-12-07 | DRG: 682 | Disposition: A | Discharge status: Skilled Nursing Facility | Payer: Medicare Other | Attending: Internal Medicine | Admitting: Internal Medicine

## 2019-12-05 ENCOUNTER — Emergency Department (HOSPITAL_COMMUNITY): Payer: Medicare Other

## 2019-12-05 ENCOUNTER — Encounter (HOSPITAL_COMMUNITY): Payer: Self-pay

## 2019-12-05 DIAGNOSIS — E876 Hypokalemia: Secondary | ICD-10-CM | POA: Diagnosis present

## 2019-12-05 DIAGNOSIS — I129 Hypertensive chronic kidney disease with stage 1 through stage 4 chronic kidney disease, or unspecified chronic kidney disease: Secondary | ICD-10-CM | POA: Diagnosis present

## 2019-12-05 DIAGNOSIS — S50311A Abrasion of right elbow, initial encounter: Secondary | ICD-10-CM | POA: Diagnosis present

## 2019-12-05 DIAGNOSIS — N400 Enlarged prostate without lower urinary tract symptoms: Secondary | ICD-10-CM | POA: Diagnosis present

## 2019-12-05 DIAGNOSIS — Z20822 Contact with and (suspected) exposure to covid-19: Secondary | ICD-10-CM | POA: Diagnosis present

## 2019-12-05 DIAGNOSIS — E440 Moderate protein-calorie malnutrition: Secondary | ICD-10-CM | POA: Diagnosis present

## 2019-12-05 DIAGNOSIS — W19XXXA Unspecified fall, initial encounter: Secondary | ICD-10-CM

## 2019-12-05 DIAGNOSIS — M6282 Rhabdomyolysis: Secondary | ICD-10-CM | POA: Diagnosis present

## 2019-12-05 DIAGNOSIS — N179 Acute kidney failure, unspecified: Principal | ICD-10-CM | POA: Diagnosis present

## 2019-12-05 DIAGNOSIS — N1831 Chronic kidney disease, stage 3a: Secondary | ICD-10-CM | POA: Diagnosis present

## 2019-12-05 DIAGNOSIS — D696 Thrombocytopenia, unspecified: Secondary | ICD-10-CM | POA: Diagnosis present

## 2019-12-05 DIAGNOSIS — I2510 Atherosclerotic heart disease of native coronary artery without angina pectoris: Secondary | ICD-10-CM | POA: Diagnosis present

## 2019-12-05 DIAGNOSIS — Z806 Family history of leukemia: Secondary | ICD-10-CM | POA: Diagnosis not present

## 2019-12-05 DIAGNOSIS — F0150 Vascular dementia without behavioral disturbance: Secondary | ICD-10-CM | POA: Diagnosis not present

## 2019-12-05 DIAGNOSIS — L8990 Pressure ulcer of unspecified site, unspecified stage: Secondary | ICD-10-CM | POA: Insufficient documentation

## 2019-12-05 DIAGNOSIS — S81012A Laceration without foreign body, left knee, initial encounter: Secondary | ICD-10-CM | POA: Diagnosis present

## 2019-12-05 DIAGNOSIS — W1830XA Fall on same level, unspecified, initial encounter: Secondary | ICD-10-CM | POA: Diagnosis present

## 2019-12-05 DIAGNOSIS — R627 Adult failure to thrive: Secondary | ICD-10-CM | POA: Diagnosis present

## 2019-12-05 DIAGNOSIS — Z9861 Coronary angioplasty status: Secondary | ICD-10-CM

## 2019-12-05 DIAGNOSIS — Z6822 Body mass index (BMI) 22.0-22.9, adult: Secondary | ICD-10-CM

## 2019-12-05 DIAGNOSIS — F0390 Unspecified dementia without behavioral disturbance: Secondary | ICD-10-CM | POA: Diagnosis present

## 2019-12-05 DIAGNOSIS — E162 Hypoglycemia, unspecified: Secondary | ICD-10-CM | POA: Diagnosis present

## 2019-12-05 DIAGNOSIS — R296 Repeated falls: Secondary | ICD-10-CM | POA: Diagnosis present

## 2019-12-05 DIAGNOSIS — D631 Anemia in chronic kidney disease: Secondary | ICD-10-CM | POA: Diagnosis present

## 2019-12-05 DIAGNOSIS — E860 Dehydration: Secondary | ICD-10-CM | POA: Diagnosis present

## 2019-12-05 DIAGNOSIS — S50312A Abrasion of left elbow, initial encounter: Secondary | ICD-10-CM | POA: Diagnosis present

## 2019-12-05 DIAGNOSIS — Z66 Do not resuscitate: Secondary | ICD-10-CM | POA: Diagnosis not present

## 2019-12-05 DIAGNOSIS — R778 Other specified abnormalities of plasma proteins: Secondary | ICD-10-CM | POA: Diagnosis present

## 2019-12-05 DIAGNOSIS — K449 Diaphragmatic hernia without obstruction or gangrene: Secondary | ICD-10-CM | POA: Diagnosis present

## 2019-12-05 DIAGNOSIS — R748 Abnormal levels of other serum enzymes: Secondary | ICD-10-CM

## 2019-12-05 DIAGNOSIS — G9341 Metabolic encephalopathy: Secondary | ICD-10-CM | POA: Diagnosis present

## 2019-12-05 DIAGNOSIS — E785 Hyperlipidemia, unspecified: Secondary | ICD-10-CM | POA: Diagnosis present

## 2019-12-05 DIAGNOSIS — Z79899 Other long term (current) drug therapy: Secondary | ICD-10-CM

## 2019-12-05 DIAGNOSIS — E039 Hypothyroidism, unspecified: Secondary | ICD-10-CM | POA: Diagnosis present

## 2019-12-05 DIAGNOSIS — Z79890 Hormone replacement therapy (postmenopausal): Secondary | ICD-10-CM

## 2019-12-05 DIAGNOSIS — Z7902 Long term (current) use of antithrombotics/antiplatelets: Secondary | ICD-10-CM

## 2019-12-05 DIAGNOSIS — I480 Paroxysmal atrial fibrillation: Secondary | ICD-10-CM | POA: Diagnosis present

## 2019-12-05 DIAGNOSIS — I4891 Unspecified atrial fibrillation: Secondary | ICD-10-CM | POA: Diagnosis not present

## 2019-12-05 DIAGNOSIS — I10 Essential (primary) hypertension: Secondary | ICD-10-CM | POA: Diagnosis present

## 2019-12-05 DIAGNOSIS — R001 Bradycardia, unspecified: Secondary | ICD-10-CM | POA: Diagnosis not present

## 2019-12-05 LAB — URINALYSIS, ROUTINE W REFLEX MICROSCOPIC
Bilirubin Urine: NEGATIVE
Glucose, UA: NEGATIVE mg/dL
Hgb urine dipstick: NEGATIVE
Ketones, ur: 5 mg/dL — AB
Leukocytes,Ua: NEGATIVE
Nitrite: NEGATIVE
Protein, ur: NEGATIVE mg/dL
Specific Gravity, Urine: 1.024 (ref 1.005–1.030)
pH: 5 (ref 5.0–8.0)

## 2019-12-05 LAB — COMPREHENSIVE METABOLIC PANEL
ALT: 13 U/L (ref 0–44)
AST: 42 U/L — ABNORMAL HIGH (ref 15–41)
Albumin: 3.1 g/dL — ABNORMAL LOW (ref 3.5–5.0)
Alkaline Phosphatase: 59 U/L (ref 38–126)
Anion gap: 13 (ref 5–15)
BUN: 23 mg/dL (ref 8–23)
CO2: 23 mmol/L (ref 22–32)
Calcium: 8.3 mg/dL — ABNORMAL LOW (ref 8.9–10.3)
Chloride: 105 mmol/L (ref 98–111)
Creatinine, Ser: 1.38 mg/dL — ABNORMAL HIGH (ref 0.61–1.24)
GFR calc Af Amer: 50 mL/min — ABNORMAL LOW (ref >60)
GFR calc non Af Amer: 43 mL/min — ABNORMAL LOW (ref >60)
Glucose, Bld: 88 mg/dL (ref 70–99)
Potassium: 3.4 mmol/L — ABNORMAL LOW (ref 3.5–5.1)
Sodium: 141 mmol/L (ref 135–145)
Total Bilirubin: 1.9 mg/dL — ABNORMAL HIGH (ref 0.3–1.2)
Total Protein: 5.6 g/dL — ABNORMAL LOW (ref 6.5–8.1)

## 2019-12-05 LAB — CBC
HCT: 35 % — ABNORMAL LOW (ref 39.0–52.0)
Hemoglobin: 11.8 g/dL — ABNORMAL LOW (ref 13.0–17.0)
MCH: 30.3 pg (ref 26.0–34.0)
MCHC: 33.7 g/dL (ref 30.0–36.0)
MCV: 90 fL (ref 80.0–100.0)
Platelets: 135 10*3/uL — ABNORMAL LOW (ref 150–400)
RBC: 3.89 MIL/uL — ABNORMAL LOW (ref 4.22–5.81)
RDW: 14.6 % (ref 11.5–15.5)
WBC: 8.8 10*3/uL (ref 4.0–10.5)
nRBC: 0 % (ref 0.0–0.2)

## 2019-12-05 LAB — CBC WITH DIFFERENTIAL/PLATELET
Abs Immature Granulocytes: 0.03 10*3/uL (ref 0.00–0.07)
Basophils Absolute: 0 10*3/uL (ref 0.0–0.1)
Basophils Relative: 0 %
Eosinophils Absolute: 0 10*3/uL (ref 0.0–0.5)
Eosinophils Relative: 0 %
HCT: 39.6 % (ref 39.0–52.0)
Hemoglobin: 13.4 g/dL (ref 13.0–17.0)
Immature Granulocytes: 0 %
Lymphocytes Relative: 8 %
Lymphs Abs: 0.7 10*3/uL (ref 0.7–4.0)
MCH: 30.3 pg (ref 26.0–34.0)
MCHC: 33.8 g/dL (ref 30.0–36.0)
MCV: 89.6 fL (ref 80.0–100.0)
Monocytes Absolute: 0.7 10*3/uL (ref 0.1–1.0)
Monocytes Relative: 7 %
Neutro Abs: 8.2 10*3/uL — ABNORMAL HIGH (ref 1.7–7.7)
Neutrophils Relative %: 85 %
Platelets: 145 10*3/uL — ABNORMAL LOW (ref 150–400)
RBC: 4.42 MIL/uL (ref 4.22–5.81)
RDW: 14.6 % (ref 11.5–15.5)
WBC: 9.6 10*3/uL (ref 4.0–10.5)
nRBC: 0 % (ref 0.0–0.2)

## 2019-12-05 LAB — CREATININE, SERUM
Creatinine, Ser: 1.44 mg/dL — ABNORMAL HIGH (ref 0.61–1.24)
GFR calc Af Amer: 48 mL/min — ABNORMAL LOW (ref >60)
GFR calc non Af Amer: 41 mL/min — ABNORMAL LOW (ref >60)

## 2019-12-05 LAB — CK
Total CK: 1046 U/L — ABNORMAL HIGH (ref 49–397)
Total CK: 814 U/L — ABNORMAL HIGH (ref 49–397)

## 2019-12-05 LAB — TSH: TSH: 4.325 u[IU]/mL (ref 0.350–4.500)

## 2019-12-05 LAB — TROPONIN I (HIGH SENSITIVITY)
Troponin I (High Sensitivity): 59 ng/L — ABNORMAL HIGH (ref <18)
Troponin I (High Sensitivity): 41 ng/L — ABNORMAL HIGH (ref <18)
Troponin I (High Sensitivity): 65 ng/L — ABNORMAL HIGH (ref <18)
Troponin I (High Sensitivity): 66 ng/L — ABNORMAL HIGH (ref <18)

## 2019-12-05 LAB — SARS CORONAVIRUS 2 BY RT PCR (HOSPITAL ORDER, PERFORMED IN ~~LOC~~ HOSPITAL LAB): SARS Coronavirus 2: NEGATIVE

## 2019-12-05 MED ORDER — BISACODYL 10 MG RE SUPP: 10.0000 mg | Freq: Every day | RECTAL | Status: DC | PRN

## 2019-12-05 MED ORDER — POLYETHYLENE GLYCOL 3350 17 G PO PACK: 17.0000 g | PACK | Freq: Every day | ORAL | Status: DC | PRN

## 2019-12-05 MED ORDER — CLOPIDOGREL BISULFATE 75 MG PO TABS
75.0000 mg | ORAL_TABLET | Freq: Every day | ORAL | Status: DC
  Administered 2019-12-05 – 2019-12-07 (×3): 75 mg via ORAL
  Filled 2019-12-05 (×4): qty 1

## 2019-12-05 MED ORDER — SODIUM CHLORIDE 0.9 % IV BOLUS
500.0000 mL | Freq: Once | INTRAVENOUS | Status: AC
  Administered 2019-12-05: 500 mL via INTRAVENOUS

## 2019-12-05 MED ORDER — ACETAMINOPHEN 325 MG PO TABS: 650.0000 mg | ORAL_TABLET | ORAL | Status: DC | PRN

## 2019-12-05 MED ORDER — ENOXAPARIN SODIUM 40 MG/0.4ML ~~LOC~~ SOLN
40.0000 mg | SUBCUTANEOUS | Status: DC
  Filled 2019-12-05: qty 0.4

## 2019-12-05 MED ORDER — SODIUM CHLORIDE 0.9 % IV SOLN: INTRAVENOUS | Status: DC

## 2019-12-05 MED ORDER — POTASSIUM CHLORIDE CRYS ER 20 MEQ PO TBCR
40.0000 meq | EXTENDED_RELEASE_TABLET | Freq: Once | ORAL | Status: AC
  Administered 2019-12-05: 40 meq via ORAL
  Filled 2019-12-05: qty 2

## 2019-12-05 MED ORDER — ONDANSETRON HCL 4 MG/2ML IJ SOLN: 4.0000 mg | Freq: Four times a day (QID) | INTRAMUSCULAR | Status: DC | PRN

## 2019-12-05 MED ORDER — ONDANSETRON HCL 4 MG PO TABS: 4.0000 mg | ORAL_TABLET | Freq: Four times a day (QID) | ORAL | Status: DC | PRN

## 2019-12-05 MED ORDER — LEVOTHYROXINE SODIUM 88 MCG PO TABS
88.0000 ug | ORAL_TABLET | Freq: Every day | ORAL | Status: DC
  Administered 2019-12-05 – 2019-12-07 (×3): 88 ug via ORAL
  Filled 2019-12-05 (×3): qty 1

## 2019-12-05 MED ORDER — DOCUSATE SODIUM 100 MG PO CAPS
100.0000 mg | ORAL_CAPSULE | Freq: Two times a day (BID) | ORAL | Status: DC
  Administered 2019-12-05 – 2019-12-07 (×3): 100 mg via ORAL
  Filled 2019-12-05 (×3): qty 1

## 2019-12-05 MED ORDER — METOPROLOL TARTRATE 25 MG PO TABS
12.5000 mg | ORAL_TABLET | Freq: Two times a day (BID) | ORAL | Status: DC
  Administered 2019-12-05: 12.5 mg via ORAL
  Filled 2019-12-05 (×2): qty 1

## 2019-12-05 NOTE — ED Notes (Signed)
Patients daughter Marcelino Duster [phone]

## 2019-12-05 NOTE — ED Notes (Signed)
After fluids from EMS  BP-140/70 Temp-98.2 HR-84 CBG-117  Pt has skin tear on left knee.

## 2019-12-05 NOTE — ED Triage Notes (Signed)
Pt BIBA from home. Pt fell at some time yesterday- unwitnessed. Pt was most likely down overnight- pt is unsure. Pt c/o lumbar back pain.  80 sys on arrival, gave fluids and increased

## 2019-12-05 NOTE — H&P (Signed)
History and Physical        Hospital Admission Note Date: 12/05/2019  Patient name: Austin Mays Medical record number: [id number] Date of birth: 11-29-1923 Age: 86 y.o. Gender: male  PCP: Pearson Grippe, MD    Patient coming from: Home  I have reviewed all records in the [facility].    Chief Complaint:  Found on the floor at his home  HPI: Patient is a 86 year old male with history of hypertension, hyperlipidemia, CAD, recurrent falls, memory issues, lives alone was found on the floor at his home.  Patient fell at some time yesterday, unwitnessed and was likely on the floor overnight.  Patient unable to provide any history, has multiple abrasions on extremities and skin tear on his left knee.  History was provided by his daughter-in-law, at the bedside.  Patient lives at home alone, his son and daughter-in-law checks up on him daily.  Patient has been adamant in the past of not going to a facility however has been having memory issues (daughter-in-law, power of attorney).  No reports of any nausea vomiting diarrhea or fevers.  Per daughter-in-law, he ambulates with a walker, has a stool in his bathroom which was tipped over.  He was in the hallway, likely had crawled and laid on the floor overnight.  He has ecchymosis and abrasions to both of his elbows as well. Patient was not able to relay if he fell or not but has been having recurrent falls at home.   ED work-up/course:  Temp 97.7, respirate 16, pulse 88, BP 132/81, O2 sats 99% on room air Sodium 141, potassium 3.4, BUN 23, creatinine 1.38, albumin 3.1, AST 42 ALT 13, total protein 5.6,   CK 1046, troponin 59, hemoglobin 13.4  Chest x-ray showed large hiatal hernia, no acute cardiopulmonary disease Left elbow x-ray showed underlying osteoarthritic changes, osteoporotic bones, no fracture or dislocation Left knee  x-ray showed no fracture dislocation or joint effusion, underlying osteoporosis, extensive chondrocalcinosis Number spine x-ray showed advanced degenerative lumbar spondylosis with multilevel disc disease and facet disease, no spine fracture  Review of Systems: Positives marked in 'bold' Patient unable to provide any review of systems due to his mental status  Past Medical History: Past Medical History:  Diagnosis Date  . Coronary artery disease     Past Surgical History:  Procedure Laterality Date  . BACK SURGERY    . CARDIAC CATHETERIZATION  02/04/2000   mild MI - stent by Dr. Aleen Campi  . CAROTID STENT    . DOPPLER ECHOCARDIOGRAPHY  2006    Medications: Prior to Admission medications   Medication Sig Start Date End Date Taking? Authorizing Provider  acetaminophen (TYLENOL) 500 MG tablet Take 500 mg by mouth every 6 (six) hours as needed for moderate pain or headache.    Yes [provider]  bisacodyl (DULCOLAX) 10 MG suppository Place 10 mg rectally daily as needed for moderate constipation.   Yes [provider]  clopidogrel (PLAVIX) 75 MG tablet Take 75 mg by mouth daily.   Yes [provider]  HYDROcodone-acetaminophen (NORCO/VICODIN) 5-325 MG per tablet Take 1 tablet by mouth every 6 (six) hours as needed for moderate pain.  Yes [provider]  levothyroxine (SYNTHROID, LEVOTHROID) 88 MCG tablet Take 88 mcg by mouth daily.   Yes [provider]  metoprolol tartrate (LOPRESSOR) 25 MG tablet Take 12.5 mg by mouth 2 (two) times daily.   Yes [provider]    Allergies:  No Known Allergies  Social History:  reports that he has never smoked. He has never used smokeless tobacco. He reports that he does not drink alcohol and does not use drugs.  Family History: Family History  Problem Relation Age of Onset  . Cancer Son 6668  . Leukemia Daughter 10    Physical Exam: Blood pressure (!) 143/107, pulse 85, temperature  97.7 F (36.5 C), temperature source Oral, resp. rate 15, SpO2 100 %. General: Alert, awake, oriented x to self, NAD, Eyes: pink conjunctiva,anicteric sclera, pupils equal and reactive to light and accomodation, HEENT: normocephalic, atraumatic, oropharynx clear Neck: supple, no masses or lymphadenopathy, no goiter, no bruits, no JVD CVS: Regular rate and rhythm, without murmurs, rubs or gallops. No lower extremity edema Resp : Clear to auscultation bilaterally, no wheezing, rales or rhonchi. GI : Soft, nontender, nondistended, positive bowel sounds, no masses. No hepatomegaly. No hernia.  Musculoskeletal: No clubbing or cyanosis.  Muscle atrophy, no peripheral edema Neuro: Grossly intact, moving all 4 extremities spontaneously Psych: alert and oriented x self, appears to be somewhat confused Skin: multiple abrasions on the extremities, ecchymosis and abrasions on both elbows, large skin tear on the lateral left knee   LABS on Admission: I have personally reviewed all the labs and imagings below    Basic Metabolic Panel: Recent Labs  Lab 12/05/19 1200  NA 141  K 3.4*  CL 105  CO2 23  GLUCOSE 88  BUN 23  CREATININE 1.38*  CALCIUM 8.3*   Liver Function Tests: Recent Labs  Lab 12/05/19 1200  AST 42*  ALT 13  ALKPHOS 59  BILITOT 1.9*  PROT 5.6*  ALBUMIN 3.1*   No results for input(s): LIPASE, AMYLASE in the last 168 hours. No results for input(s): AMMONIA in the last 168 hours. CBC: Recent Labs  Lab 12/05/19 1200  WBC 9.6  NEUTROABS 8.2*  HGB 13.4  HCT 39.6  MCV 89.6  PLT 145*   Cardiac Enzymes: Recent Labs  Lab 12/05/19 1200  CKTOTAL 1,046*   BNP: Invalid input(s): POCBNP CBG: No results for input(s): GLUCAP in the last 168 hours.  Radiological Exams on Admission:  DG Chest 2 View  Result Date: 12/05/2019 CLINICAL DATA:  Fall. Back pain. Lacerations to the left elbow in left lateral knee. EXAM: CHEST - 2 VIEW COMPARISON:  Two-view chest x-ray  09/11/2019 FINDINGS: The a heart size upper limits of normal. Large hiatal hernia is again noted. No edema or effusion is present. No focal airspace disease is present. Degenerative changes are noted in the shoulders bilaterally. No acute trauma is evident. IMPRESSION: 1. No acute cardiopulmonary disease. 2. No acute trauma. 3. Large hiatal hernia. Electronically Signed   By: Marin Robertshristopher  Mattern M.D.   On: 12/05/2019 11:52   DG Lumbar Spine Complete  Result Date: 12/05/2019 CLINICAL DATA:  Larey SeatFell. Back pain. EXAM: LUMBAR SPINE - COMPLETE 4+ VIEW COMPARISON:  04/14/2018 FINDINGS: Advanced degenerative lumbar spondylosis with multilevel disc disease and facet disease. No acute lumbar spine compression fractures identified. Mild remote compression deformity of T12 is noted. The visualized bony pelvis is intact. IMPRESSION: 1. Advanced degenerative lumbar spondylosis with multilevel disc disease and facet disease. 2. No acute lumbar spine  fracture. Electronically Signed   By: Rudie Meyer M.D.   On: 12/05/2019 11:56   DG Elbow Complete Left  Result Date: 12/05/2019 CLINICAL DATA:  Pain following fall EXAM: LEFT ELBOW - COMPLETE 3+ VIEW COMPARISON:  None. FINDINGS: Frontal, oblique, and lateral views were obtained. Bones are osteoporotic. There is no appreciable fracture or dislocation. No joint effusion. There is generalized joint space narrowing. No erosive change. IMPRESSION: Underlying osteoarthritic change. Bones osteoporotic. No fracture or dislocation. Electronically Signed   By: Bretta Bang III M.D.   On: 12/05/2019 11:53   DG Knee Complete 4 Views Left  Result Date: 12/05/2019 CLINICAL DATA:  Pain following fall EXAM: LEFT KNEE - COMPLETE 4+ VIEW COMPARISON:  None. FINDINGS: Frontal, tunnel, oblique, and lateral views were obtained. There is underlying osteoporosis. There is no fracture or dislocation. No joint effusion. There is relatively mild narrowing of the patellofemoral joint. Other  joint spaces appear unremarkable. There is extensive chondrocalcinosis. No erosion. There are multiple foci of arterial vascular calcification. IMPRESSION: No fracture, dislocation, or joint effusion. There is underlying osteoporosis. There is mild narrowing of the patellofemoral joint. Other joint spaces unremarkable. Extensive chondrocalcinosis present which may be seen with osteoarthritis or with calcium pyrophosphate deposition disease. Arterial vascular calcification consistent with atherosclerosis noted. Electronically Signed   By: Bretta Bang III M.D.   On: 12/05/2019 11:55      EKG: Independently reviewed.  Rate 96, afib QTc 518, LBBB, (not new)   Assessment/Plan Principal Problem:   Acute kidney injury (HCC) with acute rhabdomyolysis secondary to mechanical fall -Appears to be severely dehydrated, UA shows ketones -Placed on IV fluid hydration, normal saline at 125 cc an hour, follow serial CKs, creatinine function  Active Problems: Mechanical falls, adult failure to thrive -Per daughter-in-law, lives alone at home, has been having recurrent falls, unable to care for himself and difficult for family.  Patient has been mostly eating peanut butter saltines and Pepsi, poor oral intake -PT OT to evaluate, family requested higher level of care for patient safety due to recurrent falls, FTT  A. fib on EKG -Continue metoprolol, rate controlled, not an anticoagulation candidate due to recurrent falls   Rhabdomyolysis -Likely due to #1, patient on the floor for prolonged time, no trauma on imaging -Continue IV fluid hydration follow serial CK  Elevated troponin -Likely due to rhabdomyolysis, no chest pain or acute shortness of breath, follow serial troponin    CAD (coronary artery disease) -Currently no acute issues, continue Plavix, metoprolol    Hypothyroidism -Continue Synthroid, follow TSH    HTN (hypertension) -Stable, continue metoprolol  Acute encephalopathy on  dementia (HCC) -Mental status worsening likely due to #1  -Per family, patient has been having memory issues, worsening, having recurrent falls, poor oral intake and difficulty caring for himself, ambulating with a walker, lives alone  Hypokalemia -Replaced  DVT prophylaxis:   CODE STATUS: DNR status, discussed with patient's daughter in law at the bedside who also discussed with her husband (patient's son)  Consults called: None  Family Communication: Admission, patients condition and plan of care including tests being ordered have been discussed with the patient and daughter-in-law at the bedside who indicates understanding and agree with the plan and Code Status  Admission status:   The medical decision making on this patient was of high complexity and the patient is at high risk for clinical deterioration, therefore this is a level 3 admission.  Severity of Illness:      The appropriate  patient status for this patient is INPATIENT. Inpatient status is judged to be reasonable and necessary in order to provide the required intensity of service to ensure the patient's safety. The patient's presenting symptoms, physical exam findings, and initial radiographic and laboratory data in the context of their chronic comorbidities is felt to place them at high risk for further clinical deterioration. Furthermore, it is not anticipated that the patient will be medically stable for discharge from the hospital within 2 midnights of admission. The following factors support the patient status of inpatient.   " The patient's presenting symptoms include mechanical fall, acute rhabdomyolysis, acute kidney injury.  Elevated troponin " The worrisome physical exam findings include severe dehydration, multiple abrasions on extremities, skin tear on the left knee, appears to be somewhat confused " The initial radiographic and laboratory data are worrisome because of rhabdomyolysis with elevated CK, elevated  troponin, creatinine " The chronic co-morbidities include likely has underlying dementia, hypertension, CAD, failure to thrive   * I certify that at the point of admission it is my clinical judgment that the patient will require inpatient hospital care spanning beyond 2 midnights from the point of admission due to high intensity of service, high risk for further deterioration and high frequency of surveillance required.*    Time Spent on Admission:      Perri Aragones M.D. [facility] 12/05/2019, 3:51 PM

## 2019-12-05 NOTE — ED Provider Notes (Signed)
[facility]-EMERGENCY DEPT Provider Note   CSN: [id number] Arrival date & time: 12/05/19  1009     History Chief Complaint  Patient presents with   Austin Mays is a 86 y.o. male. Level 5 caveat due to dementia/altered mental status. HPI Patient brought in after a fall.  Family members found patient at home.  Reportedly lives alone.  Patient cannot tell me if he fell or not.  Does not really know what happened.  Denies pain at this time.  However does have tenderness on his back.  For EMS reportedly had blood pressure of 80 systolic that improved to 140 with IV fluids.  Patient reportedly has some trouble with memory at baseline.    Past Medical History:  Diagnosis Date   Coronary artery disease     Patient Active Problem List   Diagnosis Date Noted   Chronic left-sided low back pain without sciatica 12/18/2016   CAD (coronary artery disease) 12/19/2013   LBBB (left bundle branch block) 12/19/2013   Hypothyroidism 12/19/2013   BPH (benign prostatic hyperplasia) 12/19/2013   HTN (hypertension) 12/19/2013    Past Surgical History:  Procedure Laterality Date   BACK SURGERY     CARDIAC CATHETERIZATION  02/04/2000   mild MI - stent by Dr. Aleen Campi   CAROTID STENT     DOPPLER ECHOCARDIOGRAPHY  2006       Family History  Problem Relation Age of Onset   Cancer Son 7   Leukemia Daughter 10    Social History   Tobacco Use   Smoking status: Never Smoker   Smokeless tobacco: Never Used  Substance Use Topics   Alcohol use: No   Drug use: No    Home Medications Prior to Admission medications   Medication Sig Start Date End Date Taking? Authorizing Provider  acetaminophen (TYLENOL) 500 MG tablet Take 500 mg by mouth every 6 (six) hours as needed for moderate pain or headache.    Yes [provider]  bisacodyl (DULCOLAX) 10 MG suppository Place 10 mg rectally daily as needed for moderate constipation.    Yes [provider]  clopidogrel (PLAVIX) 75 MG tablet Take 75 mg by mouth daily.   Yes [provider]  HYDROcodone-acetaminophen (NORCO/VICODIN) 5-325 MG per tablet Take 1 tablet by mouth every 6 (six) hours as needed for moderate pain.   Yes [provider]  levothyroxine (SYNTHROID, LEVOTHROID) 88 MCG tablet Take 88 mcg by mouth daily.   Yes [provider]  metoprolol tartrate (LOPRESSOR) 25 MG tablet Take 12.5 mg by mouth 2 (two) times daily.   Yes [provider]    Allergies    Patient has no known allergies.  Review of Systems   Review of Systems  Unable to perform ROS: Mental status change    Physical Exam Updated Vital Signs BP (!) 143/107    Pulse 85    Temp 97.7 F (36.5 C) (Oral)    Resp 15    SpO2 100%   Physical Exam Vitals reviewed.  HENT:     Head: Atraumatic.  Eyes:     Pupils: Pupils are equal, round, and reactive to light.  Cardiovascular:     Rate and Rhythm: Regular rhythm.  Pulmonary:     Breath sounds: No wheezing or rhonchi.  Abdominal:     Tenderness: There is no abdominal tenderness.  Musculoskeletal:     Cervical back: Neck supple.     Comments: Some likely  chronic deformity of left elbow.  Some mild pain with movement.  Some bruising/pressure wound.  Also has likely pressure wound on left knee.  Some mild pain with movement also.  Tenderness over lower thoracic or upper lumbar spine.  Skin:    Capillary Refill: Capillary refill takes less than 2 seconds.  Neurological:     Mental Status: He is alert.     Comments: Awake and pleasant, but some confusion.     ED Results / Procedures / Treatments   Labs (all labs ordered are listed, but only abnormal results are displayed) Labs Reviewed  COMPREHENSIVE METABOLIC PANEL - Abnormal; Notable for the following components:      Result Value   Potassium 3.4 (*)    Creatinine, Ser 1.38 (*)    Calcium 8.3 (*)    Total Protein 5.6 (*)    Albumin 3.1 (*)     AST 42 (*)    Total Bilirubin 1.9 (*)    GFR calc non Af Amer 43 (*)    GFR calc Af Amer 50 (*)    All other components within normal limits  CBC WITH DIFFERENTIAL/PLATELET - Abnormal; Notable for the following components:   Platelets 145 (*)    Neutro Abs 8.2 (*)    All other components within normal limits  CK - Abnormal; Notable for the following components:   Total CK 1,046 (*)    All other components within normal limits  TROPONIN I (HIGH SENSITIVITY) - Abnormal; Notable for the following components:   Troponin I (High Sensitivity) 59 (*)    All other components within normal limits  URINALYSIS, ROUTINE W REFLEX MICROSCOPIC  TROPONIN I (HIGH SENSITIVITY)    EKG None  Radiology DG Chest 2 View  Result Date: 12/05/2019 CLINICAL DATA:  Fall. Back pain. Lacerations to the left elbow in left lateral knee. EXAM: CHEST - 2 VIEW COMPARISON:  Two-view chest x-ray 09/11/2019 FINDINGS: The a heart size upper limits of normal. Large hiatal hernia is again noted. No edema or effusion is present. No focal airspace disease is present. Degenerative changes are noted in the shoulders bilaterally. No acute trauma is evident. IMPRESSION: 1. No acute cardiopulmonary disease. 2. No acute trauma. 3. Large hiatal hernia. Electronically Signed   By: Marin Roberts M.D.   On: 12/05/2019 11:52   DG Lumbar Spine Complete  Result Date: 12/05/2019 CLINICAL DATA:  Larey Seat. Back pain. EXAM: LUMBAR SPINE - COMPLETE 4+ VIEW COMPARISON:  04/14/2018 FINDINGS: Advanced degenerative lumbar spondylosis with multilevel disc disease and facet disease. No acute lumbar spine compression fractures identified. Mild remote compression deformity of T12 is noted. The visualized bony pelvis is intact. IMPRESSION: 1. Advanced degenerative lumbar spondylosis with multilevel disc disease and facet disease. 2. No acute lumbar spine fracture. Electronically Signed   By: Rudie Meyer M.D.   On: 12/05/2019 11:56   DG Elbow  Complete Left  Result Date: 12/05/2019 CLINICAL DATA:  Pain following fall EXAM: LEFT ELBOW - COMPLETE 3+ VIEW COMPARISON:  None. FINDINGS: Frontal, oblique, and lateral views were obtained. Bones are osteoporotic. There is no appreciable fracture or dislocation. No joint effusion. There is generalized joint space narrowing. No erosive change. IMPRESSION: Underlying osteoarthritic change. Bones osteoporotic. No fracture or dislocation. Electronically Signed   By: Bretta Bang III M.D.   On: 12/05/2019 11:53   DG Knee Complete 4 Views Left  Result Date: 12/05/2019 CLINICAL DATA:  Pain following fall EXAM: LEFT KNEE - COMPLETE 4+ VIEW COMPARISON:  None.  FINDINGS: Frontal, tunnel, oblique, and lateral views were obtained. There is underlying osteoporosis. There is no fracture or dislocation. No joint effusion. There is relatively mild narrowing of the patellofemoral joint. Other joint spaces appear unremarkable. There is extensive chondrocalcinosis. No erosion. There are multiple foci of arterial vascular calcification. IMPRESSION: No fracture, dislocation, or joint effusion. There is underlying osteoporosis. There is mild narrowing of the patellofemoral joint. Other joint spaces unremarkable. Extensive chondrocalcinosis present which may be seen with osteoarthritis or with calcium pyrophosphate deposition disease. Arterial vascular calcification consistent with atherosclerosis noted. Electronically Signed   By: Bretta Bang III M.D.   On: 12/05/2019 11:55    Procedures Procedures (including critical care time)  Medications Ordered in ED Medications  sodium chloride 0.9 % bolus 500 mL (has no administration in time range)    ED Course  I have reviewed the triage vital signs and the nursing notes.  Pertinent labs & imaging results that were available during my care of the patient were reviewed by me and considered in my medical decision making (see chart for details).    MDM  Rules/Calculators/A&P                          Patient with likely fall.  Unknown how long was on ground but potentially overnight.  CK somewhat elevated at thousand.  Troponin also somewhat elevated.  Has had decreased oral intake and some confusion.  Imaging reassuring but with elevated lab work the feels the patient benefit from [facility] the hospital for IV fluids and monitoring.  Discussed with patient's daughter in law.  Will discuss with hospitalist Final Clinical Impression(s) / ED Diagnoses Final diagnoses:  Fall, initial encounter  Elevated troponin  Elevated CK    Rx / DC Orders ED Discharge Orders    None       Benjiman Core, MD 12/05/19 1502

## 2019-12-06 ENCOUNTER — Other Ambulatory Visit: Payer: Self-pay

## 2019-12-06 ENCOUNTER — Inpatient Hospital Stay (HOSPITAL_COMMUNITY): Payer: Medicare Other

## 2019-12-06 DIAGNOSIS — R627 Adult failure to thrive: Secondary | ICD-10-CM

## 2019-12-06 DIAGNOSIS — I4891 Unspecified atrial fibrillation: Secondary | ICD-10-CM

## 2019-12-06 LAB — CBC
HCT: 35.2 % — ABNORMAL LOW (ref 39.0–52.0)
Hemoglobin: 11.7 g/dL — ABNORMAL LOW (ref 13.0–17.0)
MCH: 30.4 pg (ref 26.0–34.0)
MCHC: 33.2 g/dL (ref 30.0–36.0)
MCV: 91.4 fL (ref 80.0–100.0)
Platelets: 130 10*3/uL — ABNORMAL LOW (ref 150–400)
RBC: 3.85 MIL/uL — ABNORMAL LOW (ref 4.22–5.81)
RDW: 14.7 % (ref 11.5–15.5)
WBC: 8.3 10*3/uL (ref 4.0–10.5)
nRBC: 0 % (ref 0.0–0.2)

## 2019-12-06 LAB — BASIC METABOLIC PANEL
Anion gap: 15 (ref 5–15)
BUN: 25 mg/dL — ABNORMAL HIGH (ref 8–23)
CO2: 21 mmol/L — ABNORMAL LOW (ref 22–32)
Calcium: 8.4 mg/dL — ABNORMAL LOW (ref 8.9–10.3)
Chloride: 105 mmol/L (ref 98–111)
Creatinine, Ser: 1.36 mg/dL — ABNORMAL HIGH (ref 0.61–1.24)
GFR calc Af Amer: 51 mL/min — ABNORMAL LOW (ref >60)
GFR calc non Af Amer: 44 mL/min — ABNORMAL LOW (ref >60)
Glucose, Bld: 62 mg/dL — ABNORMAL LOW (ref 70–99)
Potassium: 3.9 mmol/L (ref 3.5–5.1)
Sodium: 141 mmol/L (ref 135–145)

## 2019-12-06 LAB — ECHOCARDIOGRAM LIMITED
Height: 73 in
Weight: 2723.12 oz

## 2019-12-06 LAB — CK
Total CK: 415 U/L — ABNORMAL HIGH (ref 49–397)
Total CK: 463 U/L — ABNORMAL HIGH (ref 49–397)
Total CK: 701 U/L — ABNORMAL HIGH (ref 49–397)

## 2019-12-06 MED ORDER — SODIUM CHLORIDE 0.9 % IV SOLN: INTRAVENOUS | Status: AC
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
## 2019-12-06 NOTE — Progress Notes (Signed)
[facility]                                                                              Patient Demographics  Austin Mays, is a 86 y.o. male, DOB - 1923/05/06, EXH:[id number]  Admit date - 12/05/2019   Admitting Physician Raizel Wesolowski Jenna Luo, MD  Outpatient Primary MD for the patient is Pearson Grippe, MD  Outpatient specialists:   LOS - 1  days   Medical records reviewed and are as summarized below:    Chief Complaint  Patient presents with   Fall       Brief summary   Patient is a 86 year old male with history of hypertension, hyperlipidemia, CAD, recurrent falls, memory issues, lives alone was found on the floor at his home.  Patient fell at some time yesterday, unwitnessed and was likely on the floor overnight.  Patient unable to provide any history, has multiple abrasions on extremities and skin tear on his left knee.  History was provided by his daughter-in-law, at the bedside.  Patient lives at home alone, his son and daughter-in-law checks up on him daily.  Patient has been adamant in the past of not going to a facility however has been having memory issues (daughter-in-law, power of attorney).  No reports of any nausea vomiting diarrhea or fevers.  Per daughter-in-law, he ambulates with a walker, has a stool in his bathroom which was tipped over.  He was in the hallway, likely had crawled and laid on the floor overnight.  He has ecchymosis and abrasions to both of his elbows as well. Patient was not able to relay if he fell or not but has been having recurrent falls at home.   Assessment & Plan     Principal Problem:   Acute kidney injury (HCC) with acute rhabdomyolysis secondary to mechanical fall -Appears to be severely dehydrated, UA shows ketones, creatinine 1.4 at the time of admission -Patient was placed on IV fluid hydration, creatinine improving, 1.3  Active Problems: Mechanical falls, adult failure to thrive -Per daughter-in-law, lives alone  at home, has been having recurrent falls, unable to care for himself and difficult for family.  Patient has been mostly eating peanut butter saltines and Pepsi, poor oral intake -PT OT evaluation done, recommended SNF, social work consult placed  A. fib on EKG -Patient on metoprolol 12.5mg  BID PTA however has bradycardia, heart rate in 50s -Hold metoprolol, not on anticoagulation due to recurrent falls.    Rhabdomyolysis -Likely due to #1, patient on the floor for prolonged time, no trauma on imaging -CK trending down, patient tolerating diet, decrease IV fluids   Elevated troponin -Likely due to rhabdomyolysis, no chest pain or acute shortness of breath, serial troponin slightly trending up, follow 2D echo.      CAD (coronary artery disease) -Currently no acute issues, continue Plavix    Hypothyroidism -Continue Synthroid TSH 4.3    HTN (hypertension) -Stable.  Holding metoprolol.  BP is currently stable  Acute encephalopathy on dementia (HCC) -Mental status worsening likely due to #1 , patient is oriented to self, likely has underlying dementia -Per family, patient has been having memory  issues, worsening, having recurrent falls, poor oral intake and difficulty caring for himself, ambulating with a walker, lives alone   Code Status: DNR DVT Prophylaxis:  Lovenox  Family Communication: Discussed all imaging results, lab results, explained to the patient's daughter-in-law at the bedside (she is POA)   Disposition Plan:     Status is: Inpatient  Remains inpatient appropriate because:Inpatient level of care appropriate due to severity of illness   Dispo: The patient is from: Home              Anticipated d/c is to: SNF              Anticipated d/c date is: 2 days              Patient currently is not medically stable to d/c.      Time Spent in minutes   35 minutes  Procedures:  None  Consultants:   None  Antimicrobials:   Anti-infectives (From  admission, onward)   None          Medications  Scheduled Meds:  clopidogrel  75 mg Oral Daily   docusate sodium  100 mg Oral BID   enoxaparin (LOVENOX) injection  40 mg Subcutaneous Q24H   levothyroxine  88 mcg Oral Daily   metoprolol tartrate  12.5 mg Oral BID   Continuous Infusions:  sodium chloride 125 mL/hr at 12/06/19 1050   PRN Meds:.acetaminophen, bisacodyl, ondansetron **OR** ondansetron (ZOFRAN) IV, polyethylene glycol      Subjective:   Austin Mays was seen and examined today.  Much more alert and awake today but oriented to self, eating breakfast by himself.  Per daughter-in-law at the bedside, has waxing and waning mental status, has memory issues. Patient denies dizziness, chest pain, shortness of breath, abdominal pain, N/V.  Afebrile, no acute events overnight  Objective:   Vitals:   12/05/19 2307 12/06/19 0006 12/06/19 0311 12/06/19 0650  BP: (!) 155/82 (!) 155/82 126/73 137/64  Pulse: (!) 56 (!) 56 (!) 50 (!) 50  Resp: 20 20 20 18   Temp: 97.6 F (36.4 C) 97.6 F (36.4 C) (!) 97.5 F (36.4 C) (!) 97.4 F (36.3 C)  TempSrc: Oral Oral Oral Oral  SpO2: 100%  100% 96%  Weight:  77.2 kg    Height:  6\' 1"  (1.854 m)      Intake/Output Summary (Last 24 hours) at 12/06/2019 1201 Last data filed at 12/06/2019 1000 Gross per 24 hour  Intake 2166.1 ml  Output 50 ml  Net 2116.1 ml     Wt Readings from Last 3 Encounters:  12/06/19 77.2 kg  09/11/19 104.3 kg  06/16/17 95.3 kg     Exam  General: Alert and oriented x self,  Cardiovascular: S1 S2 auscultated, no murmurs, RRR  Respiratory: Clear to auscultation bilaterally, no wheezing, rales or rhonchi  Gastrointestinal: Soft, nontender, nondistended, + bowel sounds  Ext: no pedal edema bilaterally  Neuro: moving all 4 extremities  Musculoskeletal: No digital cyanosis, clubbing  Skin: Multiple abrasions on extremities  Psych: only oriented to self, per daughter-in-law, has  waxing and waning mental status with memory issues   Data Reviewed:  I have personally reviewed following labs and imaging studies  Micro Results Recent Results (from the past 240 hour(s))  SARS Coronavirus 2 by RT PCR (hospital order, performed in [facility] hospital lab) Nasopharyngeal Nasopharyngeal Swab     Status: None   Collection Time: 12/05/19  3:11 PM   Specimen: Nasopharyngeal Swab  Result Value Ref Range Status   SARS Coronavirus 2 NEGATIVE NEGATIVE Final    Comment: (NOTE) SARS-CoV-2 target nucleic acids are NOT DETECTED.  The SARS-CoV-2 RNA is generally detectable in upper and lower respiratory specimens during the acute phase of infection. The lowest concentration of SARS-CoV-2 viral copies this assay can detect is 250 copies / mL. A negative result does not preclude SARS-CoV-2 infection and should not be used as the sole basis for treatment or other patient management decisions.  A negative result may occur with improper specimen collection / handling, submission of specimen other than nasopharyngeal swab, presence of viral mutation(s) within the areas targeted by this assay, and inadequate number of viral copies (<250 copies / mL). A negative result must be combined with clinical observations, patient history, and epidemiological information.  Fact Sheet for Patients:   BoilerBrush.com.cy  Fact Sheet for Healthcare Providers: https://pope.com/  This test is not yet approved or  cleared by the [place] FDA and has been authorized for detection and/or diagnosis of SARS-CoV-2 by FDA under an Emergency Use Authorization (EUA).  This EUA will remain in effect (meaning this test can be used) for the duration of the COVID-19 declaration under Section 564(b)(1) of the Act, 21 U.S.C. section 360bbb-3(b)(1), unless the authorization is terminated or revoked sooner.  Performed at [facility], [address], [place], [place] [address]     Radiology Reports DG Chest 2 View  Result Date: 12/05/2019 CLINICAL DATA:  Fall. Back pain. Lacerations to the left elbow in left lateral knee. EXAM: CHEST - 2 VIEW COMPARISON:  Two-view chest x-ray 09/11/2019 FINDINGS: The a heart size upper limits of normal. Large hiatal hernia is again noted. No edema or effusion is present. No focal airspace disease is present. Degenerative changes are noted in the shoulders bilaterally. No acute trauma is evident. IMPRESSION: 1. No acute cardiopulmonary disease. 2. No acute trauma. 3. Large hiatal hernia. Electronically Signed   By: Marin Roberts M.D.   On: 12/05/2019 11:52   DG Lumbar Spine Complete  Result Date: 12/05/2019 CLINICAL DATA:  Larey Seat. Back pain. EXAM: LUMBAR SPINE - COMPLETE 4+ VIEW COMPARISON:  04/14/2018 FINDINGS: Advanced degenerative lumbar spondylosis with multilevel disc disease and facet disease. No acute lumbar spine compression fractures identified. Mild remote compression deformity of T12 is noted. The visualized bony pelvis is intact. IMPRESSION: 1. Advanced degenerative lumbar spondylosis with multilevel disc disease and facet disease. 2. No acute lumbar spine fracture. Electronically Signed   By: Rudie Meyer M.D.   On: 12/05/2019 11:56   DG Elbow Complete Left  Result Date: 12/05/2019 CLINICAL DATA:  Pain following fall EXAM: LEFT ELBOW - COMPLETE 3+ VIEW COMPARISON:  None. FINDINGS: Frontal, oblique, and lateral views were obtained. Bones are osteoporotic. There is no appreciable fracture or dislocation. No joint effusion. There is generalized joint space narrowing. No erosive change. IMPRESSION: Underlying osteoarthritic change. Bones osteoporotic. No fracture or dislocation. Electronically Signed   By: Bretta Bang III M.D.   On: 12/05/2019 11:53   DG Knee Complete 4 Views Left  Result Date: 12/05/2019 CLINICAL DATA:  Pain following fall EXAM: LEFT KNEE - COMPLETE 4+ VIEW  COMPARISON:  None. FINDINGS: Frontal, tunnel, oblique, and lateral views were obtained. There is underlying osteoporosis. There is no fracture or dislocation. No joint effusion. There is relatively mild narrowing of the patellofemoral joint. Other joint spaces appear unremarkable. There is extensive chondrocalcinosis. No erosion. There are multiple foci of arterial vascular calcification. IMPRESSION:  No fracture, dislocation, or joint effusion. There is underlying osteoporosis. There is mild narrowing of the patellofemoral joint. Other joint spaces unremarkable. Extensive chondrocalcinosis present which may be seen with osteoarthritis or with calcium pyrophosphate deposition disease. Arterial vascular calcification consistent with atherosclerosis noted. Electronically Signed   By: Bretta Bang III M.D.   On: 12/05/2019 11:55    Lab Data:  CBC: Recent Labs  Lab 12/05/19 1200 12/05/19 1621 12/06/19 0817  WBC 9.6 8.8 8.3  NEUTROABS 8.2*  --   --   HGB 13.4 11.8* 11.7*  HCT 39.6 35.0* 35.2*  MCV 89.6 90.0 91.4  PLT 145* 135* 130*   Basic Metabolic Panel: Recent Labs  Lab 12/05/19 1200 12/05/19 1621 12/06/19 0817  NA 141  --  141  K 3.4*  --  3.9  CL 105  --  105  CO2 23  --  21*  GLUCOSE 88  --  62*  BUN 23  --  25*  CREATININE 1.38* 1.44* 1.36*  CALCIUM 8.3*  --  8.4*   GFR: Estimated Creatinine Clearance: 36.3 mL/min (A) (by C-G formula based on SCr of 1.36 mg/dL (H)). Liver Function Tests: Recent Labs  Lab 12/05/19 1200  AST 42*  ALT 13  ALKPHOS 59  BILITOT 1.9*  PROT 5.6*  ALBUMIN 3.1*   No results for input(s): LIPASE, AMYLASE in the last 168 hours. No results for input(s): AMMONIA in the last 168 hours. Coagulation Profile: No results for input(s): INR, PROTIME in the last 168 hours. Cardiac Enzymes: Recent Labs  Lab 12/05/19 1200 12/05/19 1547 12/05/19 2335 12/06/19 0817  CKTOTAL 1,046* 814* 701* 463*   BNP (last 3 results) No results for input(s):  PROBNP in the last 8760 hours. HbA1C: No results for input(s): HGBA1C in the last 72 hours. CBG: No results for input(s): GLUCAP in the last 168 hours. Lipid Profile: No results for input(s): CHOL, HDL, LDLCALC, TRIG, CHOLHDL, LDLDIRECT in the last 72 hours. Thyroid Function Tests: Recent Labs    12/05/19 1621  TSH 4.325   Anemia Panel: No results for input(s): VITAMINB12, FOLATE, FERRITIN, TIBC, IRON, RETICCTPCT in the last 72 hours. Urine analysis:    Component Value Date/Time   COLORURINE AMBER (A) 12/05/2019 1047   APPEARANCEUR CLEAR 12/05/2019 1047   LABSPEC 1.024 12/05/2019 1047   PHURINE 5.0 12/05/2019 1047   GLUCOSEU NEGATIVE 12/05/2019 1047   HGBUR NEGATIVE 12/05/2019 1047   BILIRUBINUR NEGATIVE 12/05/2019 1047   KETONESUR 5 (A) 12/05/2019 1047   PROTEINUR NEGATIVE 12/05/2019 1047   NITRITE NEGATIVE 12/05/2019 1047   LEUKOCYTESUR NEGATIVE 12/05/2019 1047     Keonia Pasko M.D. [facility] 12/06/2019, 12:01 PM   Call night coverage person covering after 7pm

## 2019-12-06 NOTE — Progress Notes (Signed)
  Echocardiogram 2D Echocardiogram limited has been performed.  Leta Jungling M 12/06/2019, 2:44 PM

## 2019-12-06 NOTE — Evaluation (Signed)
Physical Therapy Evaluation Patient Details Name: Austin Mays MRN: [id number] DOB: 1924-02-12 Today's Date: 12/06/2019   History of Present Illness  86 yo male admitted with AKI, fall at home/found on floor. Hx of LBBB, chronic back pain, CAD, falls, dementia  Clinical Impression  On eval, pt required Mod assist +2 for safety for mobility. He was able to stand and take a few side steps along the bedside with use of a RW. HIGH FALL RISK. Gait appears ataxic. Pt acknowledges having some "equilibrium problems". He tolerated activity fairly well. Bowel incontinence noted during session-nursing in to assist. Recommendation is for SNF rehab. Pt currently cannot safely manage at home alone.     Follow Up Recommendations SNF    Equipment Recommendations  None recommended by PT    Recommendations for Other Services OT consult     Precautions / Restrictions Precautions Precautions: Fall Precaution Comments: incontinent Restrictions Weight Bearing Restrictions: No      Mobility  Bed Mobility Overal bed mobility: Needs Assistance Bed Mobility: Supine to Sit     Supine to sit: Min assist;HOB elevated     General bed mobility comments: Assist for trunk and LEs. Increased time. Cues required.  Transfers Overall transfer level: Needs assistance Equipment used: Rolling walker (2 wheeled) Transfers: Sit to/from Stand Sit to Stand: Mod assist;From elevated surface         General transfer comment: High fall risk!. Assist to power up, stabilize, control descent.  Ambulation/Gait Ambulation/Gait assistance: Mod assist;+2 safety/equipment   Assistive device: Rolling walker (2 wheeled)       General Gait Details: High fall risk. Gait is ataxic. Assist to stabilize pt and manage RW. Side steps along bedside with RW use.  Stairs            Wheelchair Mobility    Modified Rankin (Stroke Patients Only)       Balance Overall balance assessment: Needs  assistance;History of Falls         Standing balance support: Bilateral upper extremity supported Standing balance-Leahy Scale: Poor                               Pertinent Vitals/Pain Pain Assessment: No/denies pain    Home Living Family/patient expects to be discharged to:: Unsure Living Arrangements: Alone Available Help at Discharge: Family;Available PRN/intermittently Type of Home: House         Home Equipment: Walker - 4 wheels      Prior Function Level of Independence: Independent with assistive device(s)               Hand Dominance        Extremity/Trunk Assessment   Upper Extremity Assessment Upper Extremity Assessment: Defer to OT evaluation    Lower Extremity Assessment Lower Extremity Assessment: Generalized weakness    Cervical / Trunk Assessment Cervical / Trunk Assessment: Kyphotic  Communication   Communication: HOH  Cognition Arousal/Alertness: Awake/alert Behavior During Therapy: WFL for tasks assessed/performed Overall Cognitive Status: No family/caregiver present to determine baseline cognitive functioning Area of Impairment: Orientation;Attention;Safety/judgement;Problem solving                 Orientation Level: Disoriented to;Time Current Attention Level: Alternating     Safety/Judgement: Decreased awareness of safety;Decreased awareness of deficits   Problem Solving: Requires verbal cues;Requires tactile cues        General Comments      Exercises     Assessment/Plan  PT Assessment Patient needs continued PT services  PT Problem List Decreased strength;Decreased mobility;Decreased activity tolerance;Decreased balance;Decreased knowledge of use of DME;Decreased cognition;Decreased safety awareness       PT Treatment Interventions DME instruction;Gait training;Therapeutic activities;Therapeutic exercise;Patient/family education;Balance training;Functional mobility training    PT Goals  (Current goals can be found in the Care Plan section)  Acute Rehab PT Goals Patient Stated Goal: none stated PT Goal Formulation: With patient Time For Goal Achievement: 12/20/19 Potential to Achieve Goals: Good    Frequency Min 2X/week   Barriers to discharge        Co-evaluation               AM-PAC PT "6 Clicks" Mobility  Outcome Measure Help needed turning from your back to your side while in a flat bed without using bedrails?: A Little Help needed moving from lying on your back to sitting on the side of a flat bed without using bedrails?: A Little Help needed moving to and from a bed to a chair (including a wheelchair)?: A Lot Help needed standing up from a chair using your arms (e.g., wheelchair or bedside chair)?: A Lot Help needed to walk in hospital room?: A Lot Help needed climbing 3-5 steps with a railing? : Total 6 Click Score: 13    End of Session Equipment Utilized During Treatment: Gait belt Activity Tolerance: Patient tolerated treatment well Patient left: in bed;with call bell/phone within reach;with nursing/sitter in room   PT Visit Diagnosis: Muscle weakness (generalized) (M62.81);Difficulty in walking, not elsewhere classified (R26.2);History of falling (Z91.81);Ataxic gait (R26.0);Unsteadiness on feet (R26.81)    Time: 1607-3710 PT Time Calculation (min) (ACUTE ONLY): 18 min   Charges:   PT Evaluation $PT Eval Low Complexity: 1 Low             Faye Ramsay, PT Acute Rehabilitation  Office: [phone] Pager: [phone]

## 2019-12-06 NOTE — Progress Notes (Addendum)
Pt with fall at home.Pt skin assessment noted to have multiple abrasion to his extremities and skin tear noted to left knee new foam applied. Skin tear also noted to left upper back area area cleansed and foam applied to that area. Scattered generalized bruising noted. Sacral area red area cleansed and sacral foam applied These findings were present at Pt's admission

## 2019-12-06 NOTE — TOC Progression Note (Signed)
Transition of Care [facility]) - Progression Note    Patient Details  Name: Austin Mays MRN: [id number] Date of Birth: 02-19-1924  Transition of Care [facility]) CM/SW Contact  Kaelen Brennan, Olegario Messier, RN Phone Number: 12/06/2019, 4:27 PM  Clinical Narrative: patient defers to family-dtr in law [name]-agree to fax out. Faxed out await bed offers.          Expected Discharge Plan and Services                                                 Social Determinants of Health (SDOH) Interventions    Readmission Risk Interventions No flowsheet data found.

## 2019-12-06 NOTE — Plan of Care (Signed)

## 2019-12-06 NOTE — NC FL2 (Signed)
[place] MEDICAID FL2 LEVEL OF CARE SCREENING TOOL     IDENTIFICATION  Patient Name: Austin Mays Birthdate: [birth year] Sex: male Admission Date (Current Location): 12/05/2019  [facility] and [place] Number:      Facility and Address:  [facility],  [address] [place] [address]      Provider Number: [id number]  Attending Physician Name and Address:  Cathren Harsh, MD  Relative Name and Phone Number:  Jaydyn Bozzo [phone]    Current Level of Care: Hospital Recommended Level of Care: Skilled Nursing Facility Prior Approval Number:    Date Approved/Denied:   PASRR Number: [id number]  Discharge Plan:      Current Diagnoses: Patient Active Problem List   Diagnosis Date Noted  . Acute kidney injury (HCC) 12/05/2019  . Falls 12/05/2019  . Dementia (HCC) 12/05/2019  . Failure to thrive in adult 12/05/2019  . Chronic left-sided low back pain without sciatica 12/18/2016  . CAD (coronary artery disease) 12/19/2013  . LBBB (left bundle branch block) 12/19/2013  . Hypothyroidism 12/19/2013  . BPH (benign prostatic hyperplasia) 12/19/2013  . HTN (hypertension) 12/19/2013    Orientation RESPIRATION BLADDER Height & Weight     Situation  Normal Incontinent, Continent Weight: 77.2 kg Height:  6\' 1"  (185.4 cm)  BEHAVIORAL SYMPTOMS/MOOD NEUROLOGICAL BOWEL NUTRITION STATUS      Continent Diet (Soft)  AMBULATORY STATUS COMMUNICATION OF NEEDS Skin   Limited Assist Verbally Normal                       Personal Care Assistance Level of Assistance  Feeding, Bathing, Dressing Bathing Assistance: Limited assistance Feeding assistance: Limited assistance Dressing Assistance: Limited assistance     Functional Limitations Info  Sight, Speech, Hearing Sight Info: Impaired (eyeglasses) Hearing Info: Adequate Speech Info: Adequate    SPECIAL CARE FACTORS FREQUENCY  PT (By licensed PT), OT (By licensed OT)     PT Frequency: 5x week OT  Frequency: 5x week            Contractures Contractures Info: Not present    Additional Factors Info  Code Status, Allergies Code Status Info: DNR (DNR) Allergies Info: NKA (NKA)           Current Medications (12/06/2019):  This is the current hospital active medication list Current Facility-Administered Medications  Medication Dose Route Frequency Provider Last Rate Last Admin  . 0.9 %  sodium chloride infusion   Intravenous Continuous Rai, Ripudeep K, MD 75 mL/hr at 12/06/19 1251 Rate Change at 12/06/19 1251  . acetaminophen (TYLENOL) tablet 650 mg  650 mg Oral Q4H PRN Rai, Ripudeep K, MD      . bisacodyl (DULCOLAX) suppository 10 mg  10 mg Rectal Daily PRN Rai, Ripudeep K, MD      . clopidogrel (PLAVIX) tablet 75 mg  75 mg Oral Daily Rai, Ripudeep K, MD   75 mg at 12/06/19 0847  . docusate sodium (COLACE) capsule 100 mg  100 mg Oral BID Rai, Ripudeep K, MD   100 mg at 12/06/19 0847  . enoxaparin (LOVENOX) injection 40 mg  40 mg Subcutaneous Q24H Rai, Ripudeep K, MD      . levothyroxine (SYNTHROID) tablet 88 mcg  88 mcg Oral Daily Rai, Ripudeep K, MD   88 mcg at 12/06/19 0848  . ondansetron (ZOFRAN) tablet 4 mg  4 mg Oral Q6H PRN Rai, Ripudeep K, MD       Or  . ondansetron (  ZOFRAN) injection 4 mg  4 mg Intravenous Q6H PRN Rai, Ripudeep K, MD      . polyethylene glycol (MIRALAX / GLYCOLAX) packet 17 g  17 g Oral Daily PRN Rai, Delene Ruffini, MD         Discharge Medications: Please see discharge summary for a list of discharge medications.  Relevant Imaging Results:  Relevant Lab Results:   Additional Information SS#[id number] [phone]  Terre Zabriskie, Olegario Messier, RN

## 2019-12-07 DIAGNOSIS — E876 Hypokalemia: Secondary | ICD-10-CM

## 2019-12-07 DIAGNOSIS — E162 Hypoglycemia, unspecified: Secondary | ICD-10-CM

## 2019-12-07 DIAGNOSIS — E440 Moderate protein-calorie malnutrition: Secondary | ICD-10-CM

## 2019-12-07 DIAGNOSIS — L8990 Pressure ulcer of unspecified site, unspecified stage: Secondary | ICD-10-CM | POA: Insufficient documentation

## 2019-12-07 DIAGNOSIS — W19XXXA Unspecified fall, initial encounter: Secondary | ICD-10-CM

## 2019-12-07 LAB — BASIC METABOLIC PANEL
Anion gap: 5 (ref 5–15)
BUN: 28 mg/dL — ABNORMAL HIGH (ref 8–23)
CO2: 24 mmol/L (ref 22–32)
Calcium: 7.8 mg/dL — ABNORMAL LOW (ref 8.9–10.3)
Chloride: 110 mmol/L (ref 98–111)
Creatinine, Ser: 1.5 mg/dL — ABNORMAL HIGH (ref 0.61–1.24)
GFR calc Af Amer: 46 mL/min — ABNORMAL LOW (ref >60)
GFR calc non Af Amer: 39 mL/min — ABNORMAL LOW (ref >60)
Glucose, Bld: 102 mg/dL — ABNORMAL HIGH (ref 70–99)
Potassium: 3.1 mmol/L — ABNORMAL LOW (ref 3.5–5.1)
Sodium: 139 mmol/L (ref 135–145)

## 2019-12-07 LAB — URINE CULTURE

## 2019-12-07 MED ORDER — POTASSIUM CHLORIDE CRYS ER 20 MEQ PO TBCR
40.0000 meq | EXTENDED_RELEASE_TABLET | ORAL | Status: DC
  Administered 2019-12-07: 40 meq via ORAL
  Filled 2019-12-07 (×2): qty 2

## 2019-12-07 MED ORDER — CLONAZEPAM 0.125 MG PO TBDP
0.2500 mg | ORAL_TABLET | Freq: Once | ORAL | Status: AC
  Administered 2019-12-07: 0.25 mg via ORAL
  Filled 2019-12-07: qty 2

## 2019-12-07 MED ORDER — ENSURE ENLIVE PO LIQD: 237.0000 mL | Freq: Two times a day (BID) | ORAL | Status: DC

## 2019-12-07 MED ORDER — POLYETHYLENE GLYCOL 3350 17 G PO PACK: 17.0000 g | PACK | Freq: Every day | ORAL | 0 refills | Status: AC | PRN

## 2019-12-07 MED ORDER — POTASSIUM CHLORIDE CRYS ER 20 MEQ PO TBCR: 40.0000 meq | EXTENDED_RELEASE_TABLET | ORAL | Status: AC

## 2019-12-07 MED ORDER — MIRTAZAPINE 15 MG PO TABS: 15.0000 mg | ORAL_TABLET | Freq: Every day | ORAL | Status: AC

## 2019-12-07 MED ORDER — ENSURE ENLIVE PO LIQD: 237.0000 mL | Freq: Two times a day (BID) | ORAL | 12 refills | Status: AC

## 2019-12-07 NOTE — Progress Notes (Signed)
Pt became agitated after his daughter in law left.   Saying he was being held here against his will.  Dr notified.  Klonopine ordered by MD, given.  Pt did become more calm.  Pt was transported to snf via ptar. Daughter in law was notified by the case manager.

## 2019-12-07 NOTE — TOC Transition Note (Signed)
Transition of Care [facility]) - CM/SW Discharge Note   Patient Details  Name: Austin Mays MRN: [id number] Date of Birth: 05-Apr-1923  [facility]) CM/SW Contact:  Lanier Clam, RN Phone Number: 12/07/2019, 12:17 PM   Clinical Narrative:  D/c [facility] rep Janie aware rm#[id number] tel# for report [phone]. [facility] called for 2:30pm pick up. No further CM needs.     Final next level of care: Skilled Nursing Facility Barriers to Discharge: No Barriers Identified   Patient Goals and CMS Choice Patient states their goals for this hospitalization and ongoing recovery are:: go to rehab CMS Medicare.gov Compare Post Acute Care list provided to:: Patient Represenative (must comment) Choice offered to / list presented to : [facility] POA / Guardian, Adult Children  Discharge Placement PASRR number recieved: 12/07/19            Patient chooses bed at: [facility] Patient to be transferred to facility by: [facility] Name of family member notified: Marcelino Duster [phone] Patient and family notified of of transfer: 12/07/19  Discharge Plan and Services   Discharge Planning Services: CM Consult                                 Social Determinants of Health (SDOH) Interventions     Readmission Risk Interventions No flowsheet data found.

## 2019-12-07 NOTE — TOC Progression Note (Addendum)
Transition of Care [facility]) - Progression Note    Patient Details  Name: Austin Mays MRN: [id number] Date of Birth: [birth year]  Transition of Care [facility]) CM/SW Contact  Debe Anfinson, Olegario Messier, RN Phone Number: 12/07/2019, 11:02 AM  Clinical Narrative: dtr in law Marcelino Duster chose [facility] rep Wille Celeste will check on availability-informed of covid neg 9/13-no symptoms-will await if bed available ,then start auth.  11:55a started auth [id number] d/c summary for [facility].           Expected Discharge Plan and Services                                                 Social Determinants of Health (SDOH) Interventions    Readmission Risk Interventions No flowsheet data found.

## 2019-12-07 NOTE — Discharge Summary (Addendum)
Discharge Summary  Austin DunningSterling E Campi ZOX:[id number] DOB: [birth year]  PCP: Pearson GrippeKim, James, MD  Admit date: 12/05/2019 Discharge date: 12/07/2019  Time spent: 45mins, more than 50% time spent on coordination of care.   Recommendations for Outpatient Follow-up:  1. F/u with SNF MD  for hospital discharge follow up, repeat cbc/bmp at follow up 2. Recommend palliative care following at skilled nursing facility  Discharge Diagnoses:  Active Hospital Problems   Diagnosis Date Noted  . Acute kidney injury (HCC) 12/05/2019  . Pressure injury of skin 12/07/2019  . Falls 12/05/2019  . Dementia (HCC) 12/05/2019  . Failure to thrive in adult 12/05/2019  . CAD (coronary artery disease) 12/19/2013  . BPH (benign prostatic hyperplasia) 12/19/2013  . HTN (hypertension) 12/19/2013  . Hypothyroidism 12/19/2013    Resolved Hospital Problems  No resolved problems to display.    Discharge Condition: stable  Diet recommendation: Soft diet, sitting up at 90 degree when eat due to large hiatal hernia  Filed Weights   12/06/19 0006  Weight: 77.2 kg    History of present illness:  per admitting MD Dr. Isidoro Donningai Patient is a 86 year old male with history of hypertension, hyperlipidemia, CAD, recurrent falls, memory issues, lives alone was found on the floor at his home.  Patient fell at some time yesterday, unwitnessed and was likely on the floor overnight.  Patient unable to provide any history, has multiple abrasions on extremities and skin tear on his left knee.  History was provided by his daughter-in-law, at the bedside.  Patient lives at home alone, his son and daughter-in-law checks up on him daily.  Patient has been adamant in the past of not going to a facility however has been having memory issues (daughter-in-law, power of attorney).  No reports of any nausea vomiting diarrhea or fevers.  Per daughter-in-law, he ambulates with a walker, has a stool in his bathroom which was tipped over.  He was in the  hallway, likely had crawled and laid on the floor overnight.  He has ecchymosis and abrasions to both of his elbows as well. Patient was not able to relay if he fell or not but has been having recurrent falls at home.   ED work-up/course:  Temp 97.7, respirate 16, pulse 88, BP 132/81, O2 sats 99% on room air Sodium 141, potassium 3.4, BUN 23, creatinine 1.38, albumin 3.1, AST 42 ALT 13, total protein 5.6,   CK 1046, troponin 59, hemoglobin 13.4  Chest x-ray showed large hiatal hernia, no acute cardiopulmonary disease Left elbow x-ray showed underlying osteoarthritic changes, osteoporotic bones, no fracture or dislocation Left knee x-ray showed no fracture dislocation or joint effusion, underlying osteoporosis, extensive chondrocalcinosis Number spine x-ray showed advanced degenerative lumbar spondylosis with multilevel disc disease and facet disease, no spine fracture  Hospital Course:  Principal Problem:   Acute kidney injury (HCC) Active Problems:   CAD (coronary artery disease)   Hypothyroidism   BPH (benign prostatic hyperplasia)   HTN (hypertension)   Falls   Dementia (HCC)   Failure to thrive in adult   Pressure injury of skin  Mechanical falls at home -With mild rhabdomyolysis, CK on presentation was 814, he received hydration, CK trending down to 415   CKD 3 a/anemia of chronic disease -His baseline creatinine level likely around 1.4-1.6 per chart review, hemoglobin close to baseline at 11.7 -His creatinine level during this hospitalization is 1.36-1.5, close to baseline -He does has poor oral intake and appears dehydrated, he received hydration.  Hypoglycemia -Likely due  to poor oral intake -Nutrition supplement with Ensure -Start Remeron and titrate to effect  Hypokalemia Likely due to poor oral intake Start potassium supplement MD at SNF to repeat lab to monitor electrolyte  Acute metabolic encephalopathy on chronic progressive dementia -Acute metabolic  encephalopathy likely due to dehydration hypoglycemia hypokalemia -Appear to have improved now patient report he is close to baseline, he knows the year, does not know the month, he knows he is not at home but does not know where he is ,he is oriented to self -He is pleasantly confused, no agitation  Mild thrombocytopenia -Platelet 130, no sign of bleeding -Monitor  Large hiatal hernia -Incidental finding on chest x-ray -Sitting up at 90 degrees when eating  H/o Coronary artery disease status post PCI to the OM1 in 2001, he denies chest pain, high-sensitivity troponin not suggest ACs He is on Plavix and beta-blocker  Afib? EKG obtained in the ED showed possible A. fib however it appears there is a lot of baseline artifact, telemetry in the hospital did not show any A. Fib -He is on beta-blocker, he is not a candidate for anticoagulation due to falls  He has occasionally bradycardia heart rate in the 50s, he is not symptomatic Continue metoprolol with holding parameters  Hypertension Continue metoprolol  Hypothyroidism, continue Synthroid, TSH 4.3  Failure to thrive, progressive dementia, progressive weight loss, moderate malnutrition Report about 100 pounds weight loss in the last few years Body mass index is 22.45 kg/m.  Recommend palliative care following patient at skilled nursing facility, family understand and agreeable.  Procedures:  None  Consultations:  None  Code Status: DNR  patient's daughter-in-law at the bedside updated (she is POA)  Discharge Exam: BP 135/74 (BP Location: Right Arm)   Pulse (!) 58   Temp 97.7 F (36.5 C) (Oral)   Resp 20   Ht 6\' 1"  (1.854 m)   Wt 77.2 kg   SpO2 100%   BMI 22.45 kg/m   General: NAD, pleasantly confused, cooperative Cardiovascular: RRR Respiratory: CTA BL  Discharge Instructions You were cared for by a hospitalist during your hospital stay. If you have any questions about your discharge medications or the  care you received while you were in the hospital after you are discharged, you can call the unit and asked to speak with the hospitalist on call if the hospitalist that took care of you is not available. Once you are discharged, your primary care physician will handle any further medical issues. Please note that NO REFILLS for any discharge medications will be authorized once you are discharged, as it is imperative that you return to your primary care physician (or establish a relationship with a primary care physician if you do not have one) for your aftercare needs so that they can reassess your need for medications and monitor your lab values.  Discharge Instructions    Diet general   Complete by: As directed    Discharge wound care:   Complete by: As directed    Pressure off loading   Increase activity slowly   Complete by: As directed      Allergies as of 12/07/2019   No Known Allergies     Medication List    STOP taking these medications   HYDROcodone-acetaminophen 5-325 MG tablet Commonly known as: NORCO/VICODIN     TAKE these medications   acetaminophen 500 MG tablet Commonly known as: TYLENOL Take 500 mg by mouth every 6 (six) hours as needed for moderate pain or  headache.   bisacodyl 10 MG suppository Commonly known as: DULCOLAX Place 10 mg rectally daily as needed for moderate constipation.   clopidogrel 75 MG tablet Commonly known as: PLAVIX Take 75 mg by mouth daily.   feeding supplement (ENSURE ENLIVE) Liqd Take 237 mLs by mouth 2 (two) times daily between meals.   levothyroxine 88 MCG tablet Commonly known as: SYNTHROID Take 88 mcg by mouth daily.   metoprolol tartrate 25 MG tablet Commonly known as: LOPRESSOR Take 12.5 mg by mouth 2 (two) times daily.   mirtazapine 15 MG tablet Commonly known as: Remeron Take 1 tablet (15 mg total) by mouth at bedtime.   polyethylene glycol 17 g packet Commonly known as: MIRALAX / GLYCOLAX Take 17 g by mouth daily  as needed for mild constipation.   potassium chloride SA 20 MEQ tablet Commonly known as: KLOR-CON Take 2 tablets (40 mEq total) by mouth every Monday, Wednesday, and Friday.            Discharge Care Instructions  (From admission, onward)         Start     Ordered   12/07/19 0000  Discharge wound care:       Comments: Pressure off loading   12/07/19 1113         No Known Allergies  Contact information for after-discharge care    Destination    [facility] Preferred SNF .   Service: Skilled Nursing Contact information: [address] [place] [address] [phone]                   The results of significant diagnostics from this hospitalization (including imaging, microbiology, ancillary and laboratory) are listed below for reference.    Significant Diagnostic Studies: DG Chest 2 View  Result Date: 12/05/2019 CLINICAL DATA:  Fall. Back pain. Lacerations to the left elbow in left lateral knee. EXAM: CHEST - 2 VIEW COMPARISON:  Two-view chest x-ray 09/11/2019 FINDINGS: The a heart size upper limits of normal. Large hiatal hernia is again noted. No edema or effusion is present. No focal airspace disease is present. Degenerative changes are noted in the shoulders bilaterally. No acute trauma is evident. IMPRESSION: 1. No acute cardiopulmonary disease. 2. No acute trauma. 3. Large hiatal hernia. Electronically Signed   By: Marin Roberts M.D.   On: 12/05/2019 11:52   DG Lumbar Spine Complete  Result Date: 12/05/2019 CLINICAL DATA:  Larey Seat. Back pain. EXAM: LUMBAR SPINE - COMPLETE 4+ VIEW COMPARISON:  04/14/2018 FINDINGS: Advanced degenerative lumbar spondylosis with multilevel disc disease and facet disease. No acute lumbar spine compression fractures identified. Mild remote compression deformity of T12 is noted. The visualized bony pelvis is intact. IMPRESSION: 1. Advanced degenerative lumbar spondylosis with multilevel disc  disease and facet disease. 2. No acute lumbar spine fracture. Electronically Signed   By: Rudie Meyer M.D.   On: 12/05/2019 11:56   DG Elbow Complete Left  Result Date: 12/05/2019 CLINICAL DATA:  Pain following fall EXAM: LEFT ELBOW - COMPLETE 3+ VIEW COMPARISON:  None. FINDINGS: Frontal, oblique, and lateral views were obtained. Bones are osteoporotic. There is no appreciable fracture or dislocation. No joint effusion. There is generalized joint space narrowing. No erosive change. IMPRESSION: Underlying osteoarthritic change. Bones osteoporotic. No fracture or dislocation. Electronically Signed   By: Bretta Bang III M.D.   On: 12/05/2019 11:53   DG Knee Complete 4 Views Left  Result Date: 12/05/2019 CLINICAL DATA:  Pain following fall EXAM: LEFT KNEE -  COMPLETE 4+ VIEW COMPARISON:  None. FINDINGS: Frontal, tunnel, oblique, and lateral views were obtained. There is underlying osteoporosis. There is no fracture or dislocation. No joint effusion. There is relatively mild narrowing of the patellofemoral joint. Other joint spaces appear unremarkable. There is extensive chondrocalcinosis. No erosion. There are multiple foci of arterial vascular calcification. IMPRESSION: No fracture, dislocation, or joint effusion. There is underlying osteoporosis. There is mild narrowing of the patellofemoral joint. Other joint spaces unremarkable. Extensive chondrocalcinosis present which may be seen with osteoarthritis or with calcium pyrophosphate deposition disease. Arterial vascular calcification consistent with atherosclerosis noted. Electronically Signed   By: Bretta Bang III M.D.   On: 12/05/2019 11:55   ECHOCARDIOGRAM LIMITED  Result Date: 12/06/2019    ECHOCARDIOGRAM LIMITED REPORT   Patient Name:   Trennon SANI MADARIAGA Date of Exam: 12/06/2019 Medical Rec #:  [id number]           Height:       73.0 in Accession #:    [id number]          Weight:       170.2 lb Date of Birth:  October 09, 1923           BSA:           2.009 m Patient Age:    86 years            BP:           115/73 mmHg Patient Gender: M                   HR:           53 bpm. Exam Location:  Inpatient Procedure: Limited Echo Indications:    Elevated Troponin  History:        Patient has prior history of Echocardiogram examinations, most                 recent 02/26/2005. CAD, Carotid Disease, Arrythmias:Atrial                 Fibrillation; Risk Factors:Hypertension.  Sonographer:    Leta Jungling RDCS Referring Phys: [phone] RIPUDEEP K RAI  Sonographer Comments: Technically difficult study due to poor echo windows, no parasternal window, no subcostal window and no apical window. IMPRESSIONS  1. Left ventricular endocardial border not optimally defined to evaluate regional wall motion. Left ventricular diastolic function could not be evaluated.  2. Right ventricular systolic function was not well visualized. The right ventricular size is not well visualized.  3. The mitral valve was not well visualized.  4. The aortic valve was not assessed.  5. Aortic dilatation noted. Aneurysm of the ascending aorta, measuring 47 mm. FINDINGS  Left Ventricle: Left ventricular endocardial border not optimally defined to evaluate regional wall motion. Left ventricular diastolic function could not be evaluated. Right Ventricle: The right ventricular size is not well visualized. Right vetricular wall thickness was not assessed. Right ventricular systolic function was not well visualized. Left Atrium: Left atrial size was not well visualized. Right Atrium: Right atrial size was not well visualized. Mitral Valve: The mitral valve was not well visualized. Tricuspid Valve: The tricuspid valve is not well visualized. Aortic Valve: The aortic valve was not assessed. Pulmonic Valve: The pulmonic valve was not assessed. Aorta: Aortic dilatation noted. There is an aneurysm involving the ascending aorta measuring 47 mm. Venous: The inferior vena cava was not well visualized. Armanda Magic  MD Electronically signed by Armanda Magic MD Signature Date/Time: 12/06/2019/3:09:01 PM  Final     Microbiology: Recent Results (from the past 240 hour(s))  SARS Coronavirus 2 by RT PCR (hospital order, performed in [facility] The hospital lab) Nasopharyngeal Nasopharyngeal Swab     Status: None   Collection Time: 12/05/19  3:11 PM   Specimen: Nasopharyngeal Swab  Result Value Ref Range Status   SARS Coronavirus 2 NEGATIVE NEGATIVE Final    Comment: (NOTE) SARS-CoV-2 target nucleic acids are NOT DETECTED.  The SARS-CoV-2 RNA is generally detectable in upper and lower respiratory specimens during the acute phase of infection. The lowest concentration of SARS-CoV-2 viral copies this assay can detect is 250 copies / mL. A negative result does not preclude SARS-CoV-2 infection and should not be used as the sole basis for treatment or other patient management decisions.  A negative result may occur with improper specimen collection / handling, submission of specimen other than nasopharyngeal swab, presence of viral mutation(s) within the areas targeted by this assay, and inadequate number of viral copies (<250 copies / mL). A negative result must be combined with clinical observations, patient history, and epidemiological information.  Fact Sheet for Patients:   BoilerBrush.com.cy  Fact Sheet for Healthcare Providers: https://pope.com/  This test is not yet approved or  cleared by the [place] FDA and has been authorized for detection and/or diagnosis of SARS-CoV-2 by FDA under an Emergency Use Authorization (EUA).  This EUA will remain in effect (meaning this test can be used) for the duration of the COVID-19 declaration under Section 564(b)(1) of the Act, 21 U.S.C. section 360bbb-3(b)(1), unless the authorization is terminated or revoked sooner.  Performed at [facility], [address], [place], [place]  [address]   Urine Culture     Status: Abnormal   Collection Time: 12/05/19  7:58 PM   Specimen: Urine, Clean Catch  Result Value Ref Range Status   Specimen Description   Final    URINE, CLEAN CATCH Performed at [facility], [address], [place], [place] [address]    Special Requests   Final    NONE Performed at [facility], [address], [place], [place] [address]    Culture MULTIPLE SPECIES PRESENT, SUGGEST RECOLLECTION (A)  Final   Report Status 12/07/2019 FINAL  Final     Labs: Basic Metabolic Panel: Recent Labs  Lab 12/05/19 1200 12/05/19 1621 12/06/19 0817 12/07/19 0459  NA 141  --  141 139  K 3.4*  --  3.9 3.1*  CL 105  --  105 110  CO2 23  --  21* 24  GLUCOSE 88  --  62* 102*  BUN 23  --  25* 28*  CREATININE 1.38* 1.44* 1.36* 1.50*  CALCIUM 8.3*  --  8.4* 7.8*   Liver Function Tests: Recent Labs  Lab 12/05/19 1200  AST 42*  ALT 13  ALKPHOS 59  BILITOT 1.9*  PROT 5.6*  ALBUMIN 3.1*   No results for input(s): LIPASE, AMYLASE in the last 168 hours. No results for input(s): AMMONIA in the last 168 hours. CBC: Recent Labs  Lab 12/05/19 1200 12/05/19 1621 12/06/19 0817  WBC 9.6 8.8 8.3  NEUTROABS 8.2*  --   --   HGB 13.4 11.8* 11.7*  HCT 39.6 35.0* 35.2*  MCV 89.6 90.0 91.4  PLT 145* 135* 130*   Cardiac Enzymes: Recent Labs  Lab 12/05/19 1200 12/05/19 1547 12/05/19 2335 12/06/19 0817 12/06/19 1537  CKTOTAL 1,046* 814* 701* 463* 415*   BNP: BNP (last  3 results) No results for input(s): BNP in the last 8760 hours.  ProBNP (last 3 results) No results for input(s): PROBNP in the last 8760 hours.  CBG: No results for input(s): GLUCAP in the last 168 hours.     Signed:  Albertine Grates MD, PhD, FACP  [facility] 12/07/2019, 11:57 AM

## 2019-12-07 NOTE — TOC Progression Note (Signed)
Transition of Care [facility]) - Progression Note    Patient Details  Name: Austin Mays MRN: [id number] Date of Birth: 11-12-1923  Transition of Care [facility]) CM/SW Contact  Calhoun Reichardt, Olegario Messier, RN Phone Number: 12/07/2019, 10:09 AM  Clinical Narrative:1. 1.7 mi [facility] [place] [place], [facility] [address] [place], [place] [address] [phone] Overall rating Below average 2. 2 mi [facility] [address] [place], [place] [address] [phone] Overall rating Below average 3. 2.1 mi [address] at [place], [place] [address] [place], [place] [address] [phone] Overall rating Much below average 4. 2.2 mi [facility] [address] [place], [place] [address] [phone] Overall rating Much above average 5. 2.2 mi [facility] [address] [place], [place] [address] [phone] Overall rating Below average 6. 2.7 mi [facility] [address] [place], [place] [address] [phone] Overall rating Much above average 7. 3.2 mi [facility] and [facility] [address] [place], [place] [address] [phone] Overall rating Below average 8. 3.3 mi [facility] [address] [place], [place] [address] [phone] Overall rating Below average 9. 3.5 mi [facility] [address] [place], [place] [address] [phone] Overall rating Average 10. 4.3 mi [facility] [address] [place], [place] [address] [phone] Overall rating Below average 11. 4.7 mi [facility] [employer] [employer] [facility] [address] [place], [place] [address] [phone] Overall rating Much above average 12. 5.2 mi [facility] [address] [place], [place] [address] [phone] Overall rating Much  above average 13. 6.2 mi [facility] [address] [place], [place] [address] [phone] Overall rating Average 14. 8.1 mi [facility] [address] [place], [place] [address] [phone] Overall rating Above average 15. 8.1 mi [facility] [address] [place], [place] [address] [phone] Overall rating Much below average 16. 9.7 mi [facility] [address] [place], [place] [address] [phone] Overall rating Below average 17. 10 mi [facility] [address] [place], [place] [address] [phone] Overall rating Much above average 18. 11.4 mi [place] [facility] [address] [place], [place] [address] [phone] Overall rating Much above average 19. 13.5 [facility] [address] [place], [place] [address] [phone] Overall rating Much below average 20. 13.5 mi [facility] [address] [place], [place] [address] [phone] Overall rating Much below average 21. 14.5 mi [facility] [address] [place], [place] [address] [phone] Overall rating Much below average 22. 14.9 mi [facility] [employer] [facility] [place] [address] [place], [place] [address] [phone] Overall rating Much below average 23. 15.1 mi [facility] [address] [place], [place] [address] [phone] Overall rating Below average 24. 15.3 mi [facility] [address] [place], [place] [address] [phone] Overall rating Average 25. 16 mi [facility] [address] [place], [place] [address] [phone] Overall rating Much above average 26. 16.2 mi [facility] [address] [place], [place] [address] [phone] Overall  rating Much above average 27. 16.6 mi [facility] [address] [place], [place] [address] [phone]  Overall rating Below average 28. 17.2 mi [facility] [place] [address] [place], [place] [address] [phone] Overall rating Average 29. 18.9 [facility] [address] [place], [place] [address] [phone] Overall rating Much below average 30. 19.4 mi [address] at [employer] at [facility], [place] [address] [phone] Overall rating Much above average 31. 20.6 mi [facility] [address] [place], [place] [address] [phone] Overall rating Below average 32. 20.7 mi [facility] and [facility] [address] [place], [place] [address] [phone] Overall rating Average 33. 20.9 [facility] [address] [place], [place] [address] [phone] Overall rating Below average 34. 21.1 [facility] [address] [place], [place] [address] [phone] Overall rating Much above average 35. 22 mi [facility] [address] [place], [place] [address] [phone] Overall rating Below average 36. 22.4 mi [address] [place], [place] [address] [phone] Overall rating Below average 37. 22.7 mi [facility] [address] [place], [place] [address] [phone] Overall rating Above average 38. 22.8 [address] [place], [place] [address] [phone] Overall rating Much above average 39. 22.9 [facility] [address] [place], [place] [address] [phone] Overall rating Below average 40. 23 mi [facility] [address] [place], [place] [address] [phone] Overall rating Much below average 41. 24.4 mi [facility] and [facility] [address] [place], [place] [address] [phone] Overall rating Below average 42. 24.6 [facility] [address] [place], [place] [address] [phone] Overall rating Much below average 43. 24.6 mi [facility] [address] [place], [place] [address] [phone] Overall rating Below average 44. 24.9 mi [facility] [address] [place], [place] [address]             Expected Discharge Plan and Services                                                 Social Determinants of Health (SDOH) Interventions    Readmission Risk Interventions No flowsheet data found.

## 2020-06-22 DEATH — deceased

## 2022-01-30 IMAGING — CR DG LUMBAR SPINE COMPLETE 4+V
5 series · 5 of 5 positions shown · non-contrast
Comparison: 04/14/2018

CLINICAL DATA: Fell. Back pain.

EXAM:
LUMBAR SPINE - COMPLETE 4+ VIEW

[t lumbar spine obl (1 of 2)]
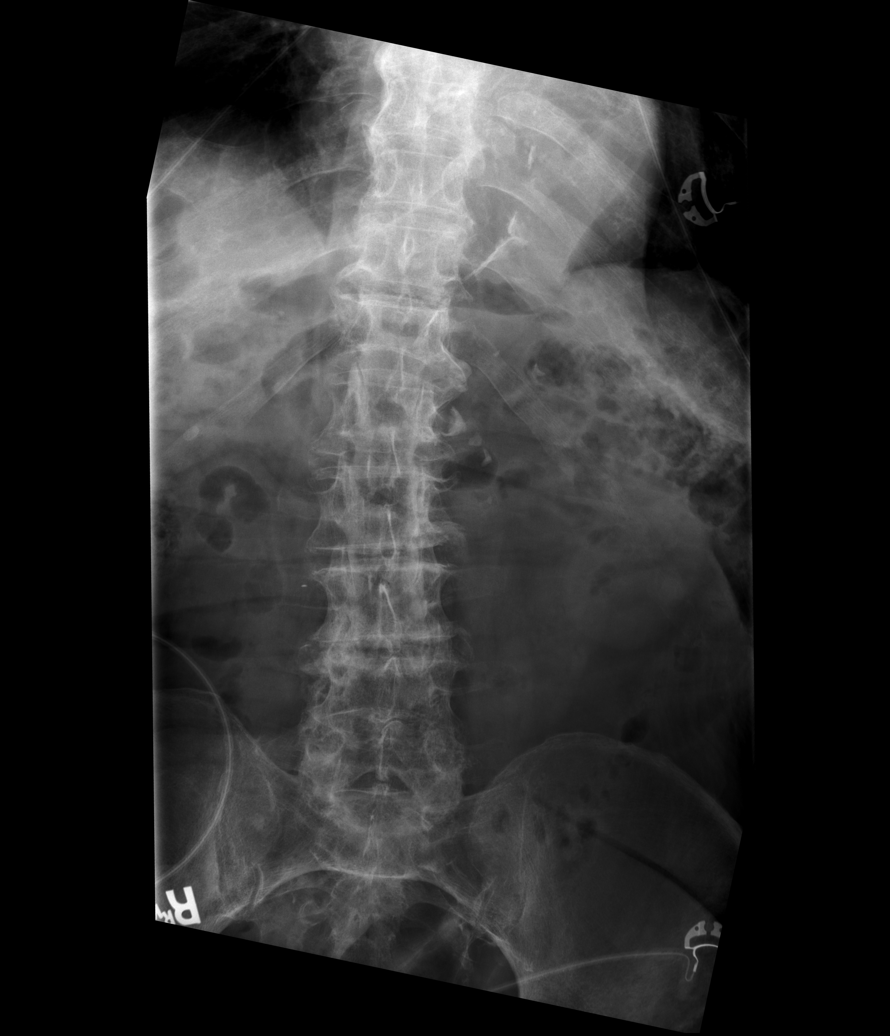

[t lumbar spine ap]
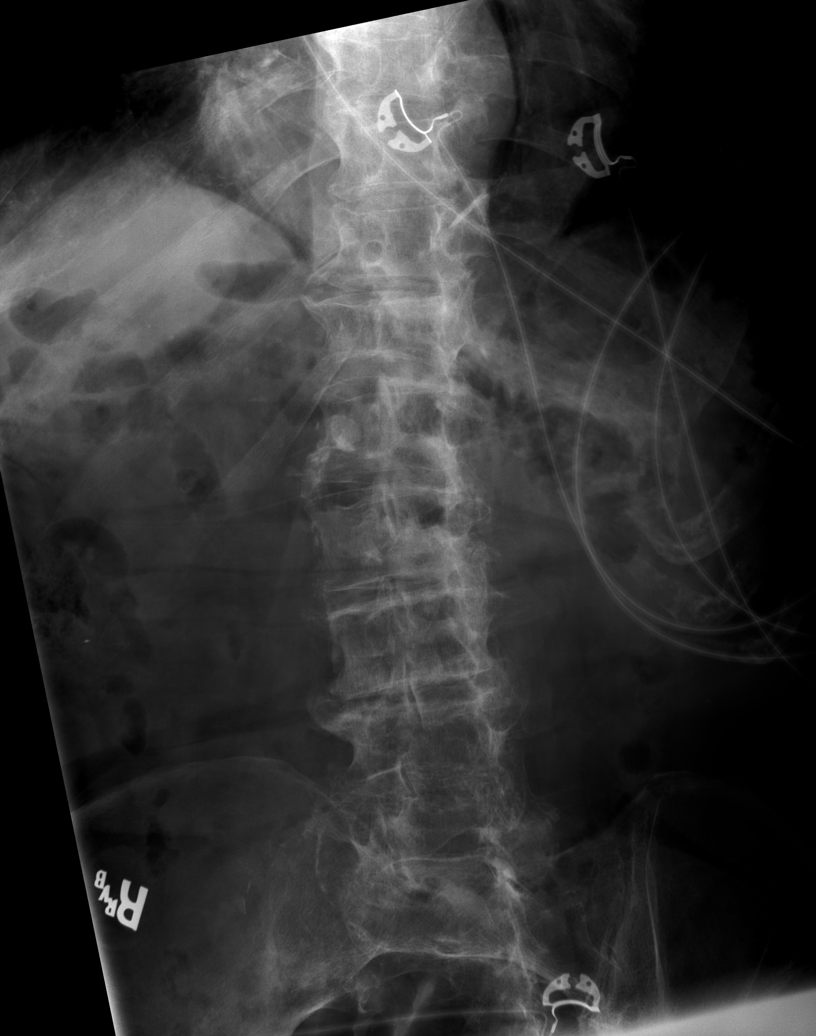

[t lumbar spine obl (2 of 2)]
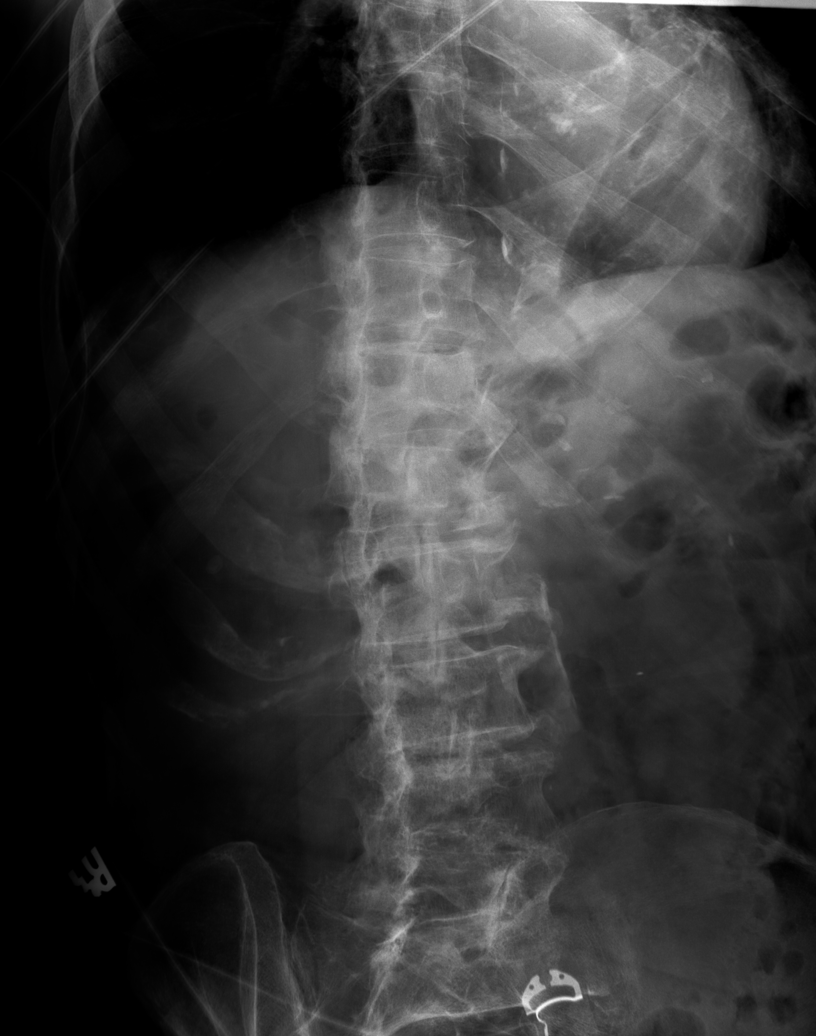

[t lumbar spine lat]
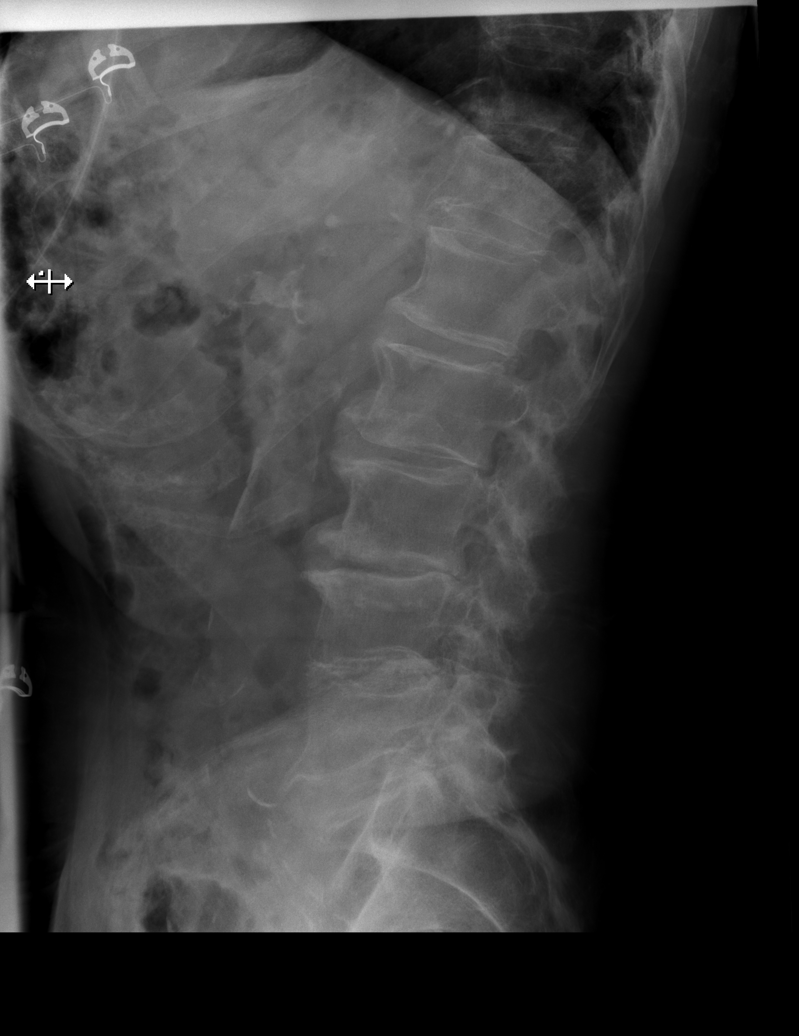

[t lumbar l-5 s-1 spot]
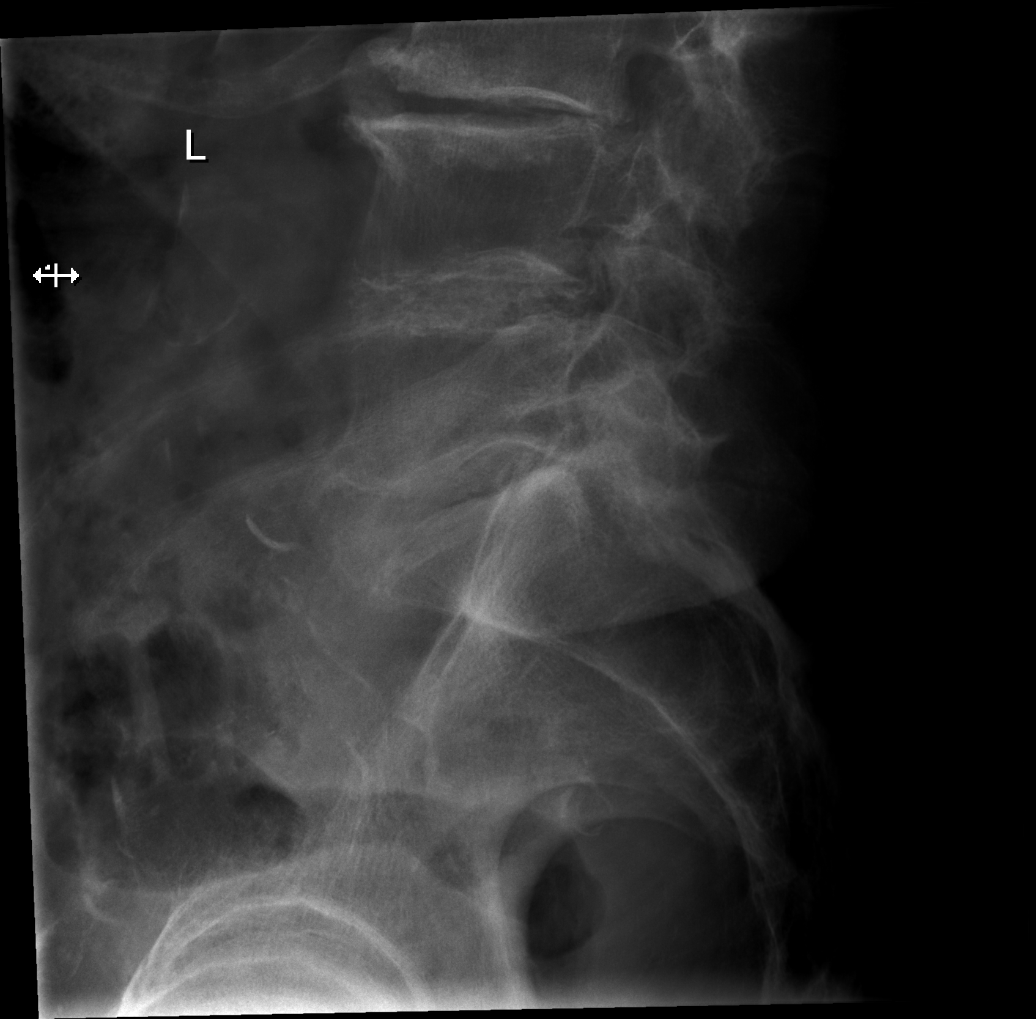

[5 of 5 positions shown; findings below may reference images not displayed]

FINDINGS: Advanced degenerative lumbar spondylosis with multilevel disc
disease and facet disease. No acute lumbar spine compression
fractures identified. Mild remote compression deformity of T12 is
noted.

The visualized bony pelvis is intact.
IMPRESSION: 1. Advanced degenerative lumbar spondylosis with multilevel disc
disease and facet disease.
2. No acute lumbar spine fracture.

## 2022-01-30 IMAGING — CR DG KNEE COMPLETE 4+V*L*
4 series · 4 of 4 positions shown · non-contrast
Comparison: None.

CLINICAL DATA: Pain following fall

EXAM:
LEFT KNEE - COMPLETE 4+ VIEW

[x knee ap left (1 of 4)]
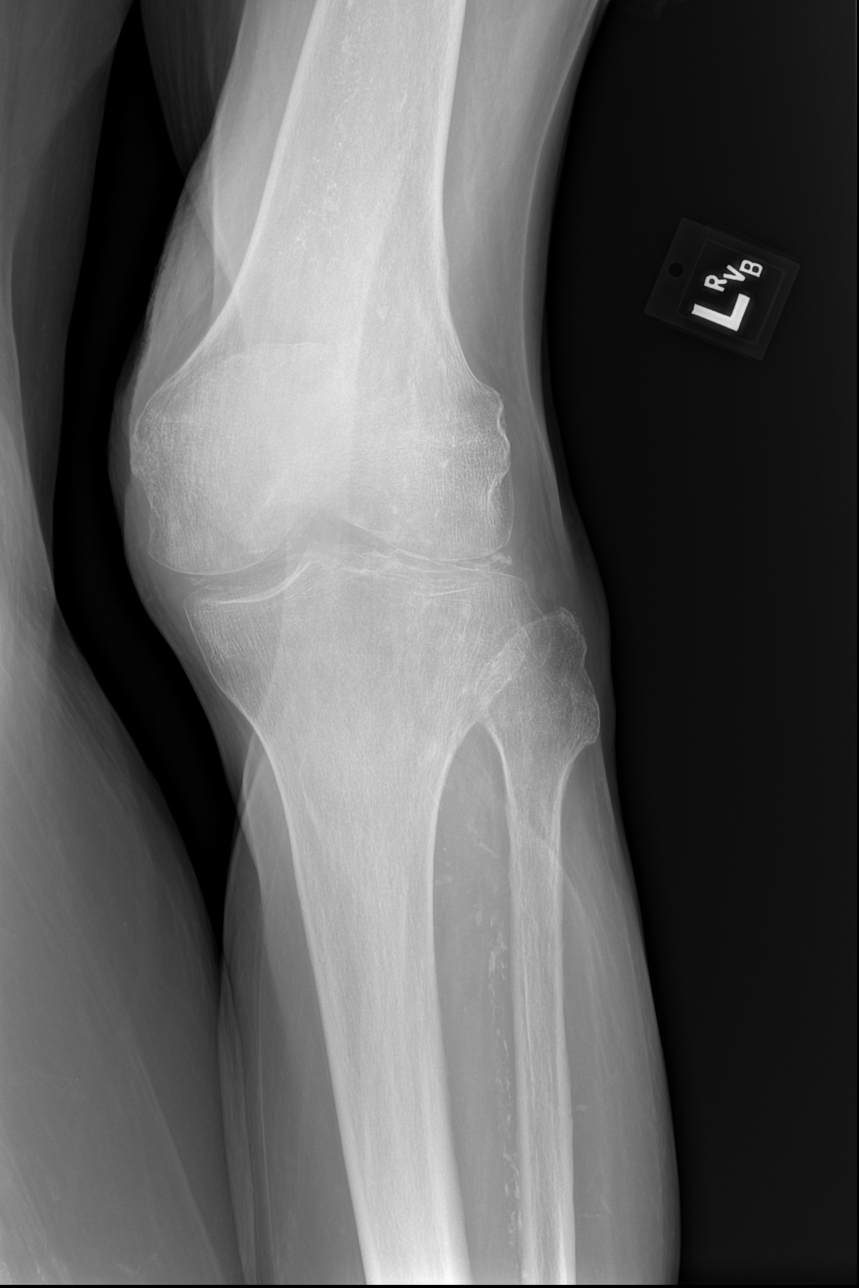

[x knee ap left (2 of 4)]
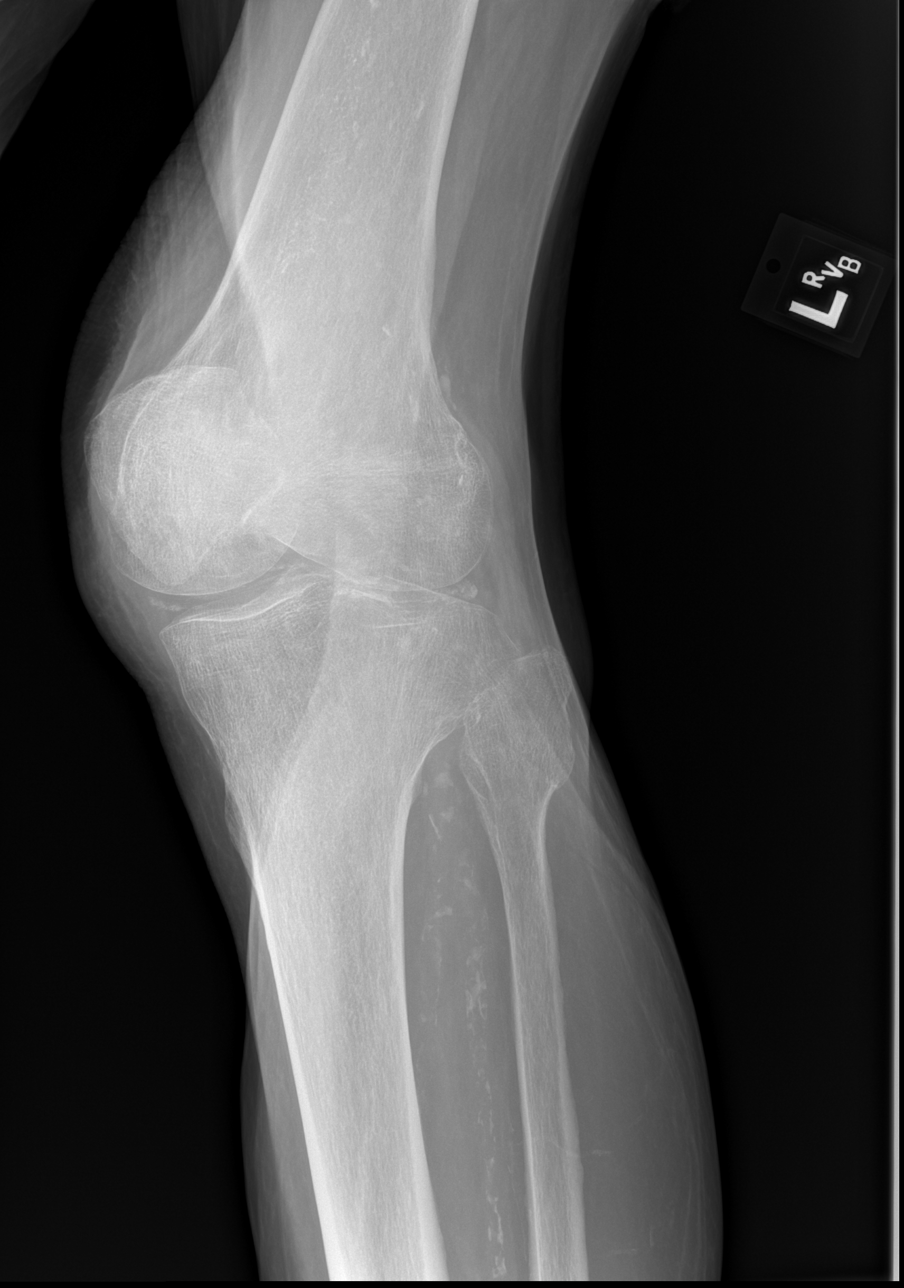

[x knee ap left (3 of 4)]
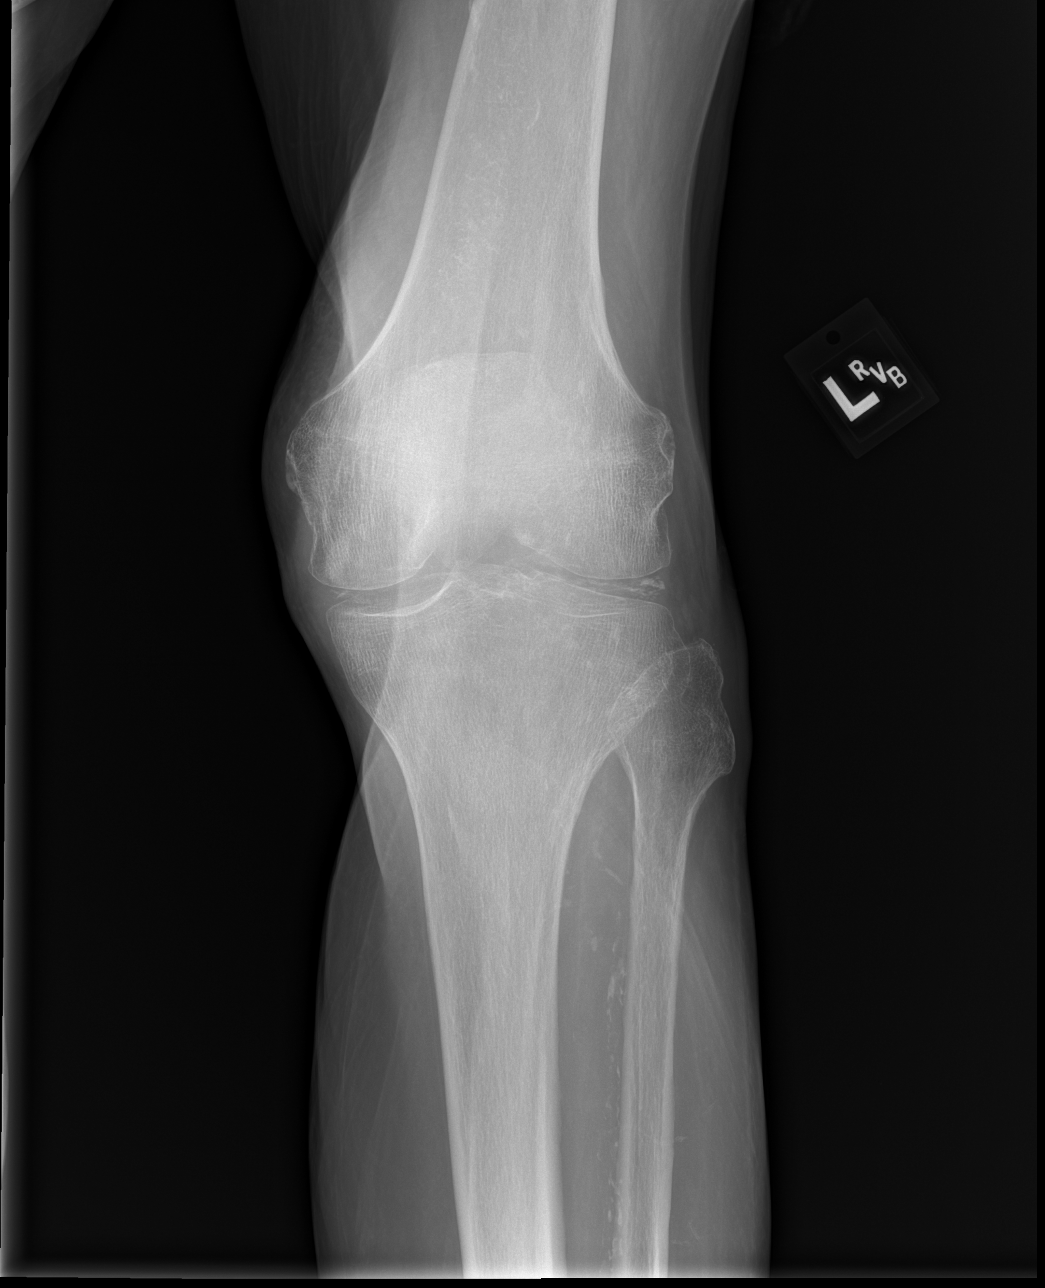

[x knee ap left (4 of 4)]
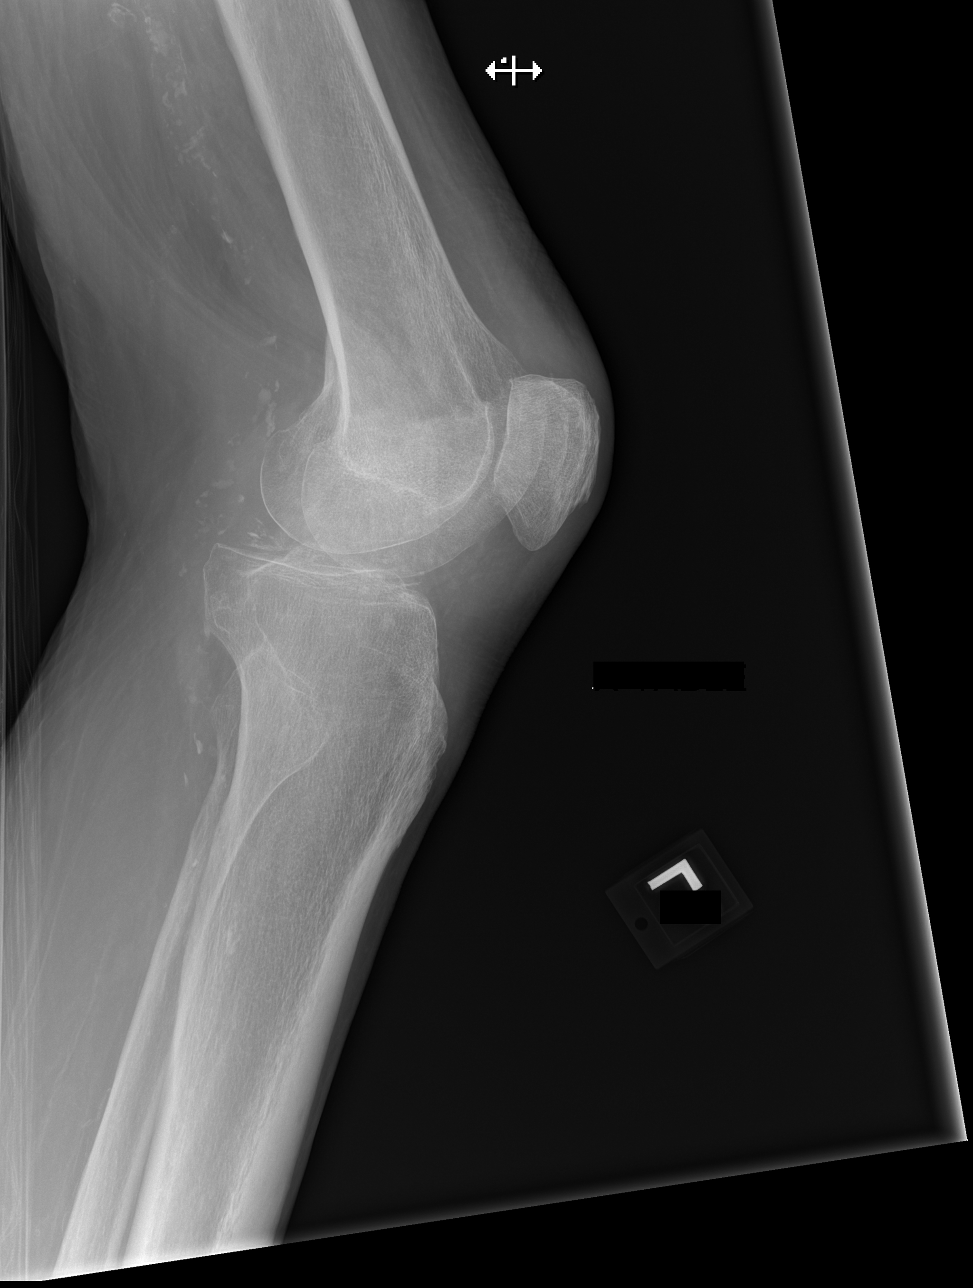

[4 of 4 positions shown; findings below may reference images not displayed]

FINDINGS: Frontal, tunnel, oblique, and lateral views were obtained. There is
underlying osteoporosis. There is no fracture or dislocation. No
joint effusion. There is relatively mild narrowing of the
patellofemoral joint. Other joint spaces appear unremarkable. There
is extensive chondrocalcinosis. No erosion. There are multiple foci
of arterial vascular calcification.
IMPRESSION: No fracture, dislocation, or joint effusion. There is underlying
osteoporosis. There is mild narrowing of the patellofemoral joint.
Other joint spaces unremarkable.

Extensive chondrocalcinosis present which may be seen with
osteoarthritis or with calcium pyrophosphate deposition disease.

Arterial vascular calcification consistent with atherosclerosis
noted.
